# Patient Record
Sex: Female | Born: 1990 | Race: White | Hispanic: No | Marital: Single | Smoking: Never smoker
Health system: Southern US, Community
[De-identification: ages and names within clinical notes are randomized; demographics above are authoritative.]

---

## 2015-11-01 ENCOUNTER — Encounter (HOSPITAL_BASED_OUTPATIENT_CLINIC_OR_DEPARTMENT_OTHER): Payer: Self-pay | Admitting: *Deleted

## 2015-11-01 ENCOUNTER — Emergency Department (HOSPITAL_BASED_OUTPATIENT_CLINIC_OR_DEPARTMENT_OTHER)
Admission: EM | Admit: 2015-11-01 | Discharge: 2015-11-01 | Disposition: A | Discharge status: Other Acute Inpatient Hospital | Payer: Managed Care, Other (non HMO) | Attending: Emergency Medicine | Admitting: Emergency Medicine

## 2015-11-01 ENCOUNTER — Inpatient Hospital Stay (HOSPITAL_COMMUNITY)
Admission: AD | Admit: 2015-11-01 | Discharge: 2015-11-10 | DRG: 885 | Disposition: A | Discharge status: Home / Self Care | Payer: Managed Care, Other (non HMO) | Source: Intra-hospital | Attending: Psychiatry | Admitting: Psychiatry

## 2015-11-01 ENCOUNTER — Encounter (HOSPITAL_COMMUNITY): Payer: Self-pay | Admitting: Psychiatry

## 2015-11-01 ENCOUNTER — Emergency Department (HOSPITAL_BASED_OUTPATIENT_CLINIC_OR_DEPARTMENT_OTHER): Payer: Managed Care, Other (non HMO)

## 2015-11-01 DIAGNOSIS — F411 Generalized anxiety disorder: Secondary | ICD-10-CM | POA: Diagnosis present

## 2015-11-01 DIAGNOSIS — R443 Hallucinations, unspecified: Secondary | ICD-10-CM

## 2015-11-01 DIAGNOSIS — F323 Major depressive disorder, single episode, severe with psychotic features: Principal | ICD-10-CM | POA: Diagnosis present

## 2015-11-01 DIAGNOSIS — F29 Unspecified psychosis not due to a substance or known physiological condition: Secondary | ICD-10-CM | POA: Insufficient documentation

## 2015-11-01 DIAGNOSIS — F4320 Adjustment disorder, unspecified: Secondary | ICD-10-CM | POA: Diagnosis present

## 2015-11-01 DIAGNOSIS — N390 Urinary tract infection, site not specified: Secondary | ICD-10-CM | POA: Insufficient documentation

## 2015-11-01 DIAGNOSIS — F23 Brief psychotic disorder: Secondary | ICD-10-CM

## 2015-11-01 DIAGNOSIS — Z79899 Other long term (current) drug therapy: Secondary | ICD-10-CM | POA: Diagnosis not present

## 2015-11-01 DIAGNOSIS — R319 Hematuria, unspecified: Secondary | ICD-10-CM

## 2015-11-01 DIAGNOSIS — F410 Panic disorder [episodic paroxysmal anxiety] without agoraphobia: Secondary | ICD-10-CM | POA: Diagnosis present

## 2015-11-01 DIAGNOSIS — F4323 Adjustment disorder with mixed anxiety and depressed mood: Secondary | ICD-10-CM

## 2015-11-01 DIAGNOSIS — R410 Disorientation, unspecified: Secondary | ICD-10-CM | POA: Diagnosis present

## 2015-11-01 LAB — RAPID URINE DRUG SCREEN, HOSP PERFORMED
AMPHETAMINES: NOT DETECTED
Barbiturates: NOT DETECTED
Benzodiazepines: NOT DETECTED
Cocaine: NOT DETECTED
Opiates: NOT DETECTED
TETRAHYDROCANNABINOL: NOT DETECTED

## 2015-11-01 LAB — URINALYSIS, ROUTINE W REFLEX MICROSCOPIC
BILIRUBIN URINE: NEGATIVE
Glucose, UA: NEGATIVE mg/dL
KETONES UR: NEGATIVE mg/dL
Nitrite: POSITIVE — AB
Protein, ur: NEGATIVE mg/dL
SPECIFIC GRAVITY, URINE: 1.011 (ref 1.005–1.030)
pH: 6 (ref 5.0–8.0)

## 2015-11-01 LAB — CBC WITH DIFFERENTIAL/PLATELET
Basophils Absolute: 0 10*3/uL (ref 0.0–0.1)
Basophils Relative: 1 %
Eosinophils Absolute: 0.1 10*3/uL (ref 0.0–0.7)
Eosinophils Relative: 1 %
HEMATOCRIT: 41 % (ref 36.0–46.0)
HEMOGLOBIN: 13.9 g/dL (ref 12.0–15.0)
LYMPHS ABS: 1.4 10*3/uL (ref 0.7–4.0)
LYMPHS PCT: 18 %
MCH: 28.8 pg (ref 26.0–34.0)
MCHC: 33.9 g/dL (ref 30.0–36.0)
MCV: 84.9 fL (ref 78.0–100.0)
MONOS PCT: 9 %
Monocytes Absolute: 0.7 10*3/uL (ref 0.1–1.0)
NEUTROS ABS: 5.6 10*3/uL (ref 1.7–7.7)
NEUTROS PCT: 71 %
Platelets: 174 10*3/uL (ref 150–400)
RBC: 4.83 MIL/uL (ref 3.87–5.11)
RDW: 12.7 % (ref 11.5–15.5)
WBC: 7.8 10*3/uL (ref 4.0–10.5)

## 2015-11-01 LAB — COMPREHENSIVE METABOLIC PANEL
ALT: 21 U/L (ref 14–54)
ANION GAP: 5 (ref 5–15)
AST: 17 U/L (ref 15–41)
Albumin: 4.9 g/dL (ref 3.5–5.0)
Alkaline Phosphatase: 48 U/L (ref 38–126)
BILIRUBIN TOTAL: 1 mg/dL (ref 0.3–1.2)
BUN: 12 mg/dL (ref 6–20)
CALCIUM: 9.3 mg/dL (ref 8.9–10.3)
CO2: 24 mmol/L (ref 22–32)
CREATININE: 0.68 mg/dL (ref 0.44–1.00)
Chloride: 106 mmol/L (ref 101–111)
GFR calc non Af Amer: >60 mL/min (ref >60)
Glucose, Bld: 96 mg/dL (ref 65–99)
Potassium: 3.5 mmol/L (ref 3.5–5.1)
SODIUM: 135 mmol/L (ref 135–145)
Total Protein: 7.5 g/dL (ref 6.5–8.1)

## 2015-11-01 LAB — URINE MICROSCOPIC-ADD ON

## 2015-11-01 LAB — ETHANOL

## 2015-11-01 LAB — PREGNANCY, URINE: PREG TEST UR: NEGATIVE

## 2015-11-01 LAB — TSH: TSH: 1.447 u[IU]/mL (ref 0.350–4.500)

## 2015-11-01 MED ORDER — DEXTROSE 5 % IV SOLN
1.0000 g | Freq: Once | INTRAVENOUS | Status: AC
  Administered 2015-11-01: 1 g via INTRAVENOUS

## 2015-11-01 MED ORDER — CEPHALEXIN 250 MG PO CAPS: 500.0000 mg | ORAL_CAPSULE | Freq: Four times a day (QID) | ORAL | Status: DC

## 2015-11-01 MED ORDER — SODIUM CHLORIDE 0.9 % IV BOLUS (SEPSIS)
1000.0000 mL | Freq: Once | INTRAVENOUS | Status: AC
  Administered 2015-11-01: 1000 mL via INTRAVENOUS

## 2015-11-01 MED ORDER — ALUM & MAG HYDROXIDE-SIMETH 200-200-20 MG/5ML PO SUSP
30.0000 mL | ORAL | Status: DC | PRN
Start: 2015-11-01 — End: 2015-11-10

## 2015-11-01 MED ORDER — CEFTRIAXONE SODIUM 1 G IJ SOLR
INTRAMUSCULAR | Status: AC
  Filled 2015-11-01: qty 10

## 2015-11-01 MED ORDER — BENZTROPINE MESYLATE 1 MG PO TABS
1.0000 mg | ORAL_TABLET | Freq: Four times a day (QID) | ORAL | Status: DC | PRN
  Administered 2015-11-01 – 2015-11-02 (×3): 1 mg via ORAL
  Filled 2015-11-01 (×4): qty 1

## 2015-11-01 MED ORDER — TRAZODONE HCL 100 MG PO TABS
100.0000 mg | ORAL_TABLET | Freq: Every evening | ORAL | Status: DC | PRN
  Administered 2015-11-01 – 2015-11-09 (×8): 100 mg via ORAL
  Filled 2015-11-01: qty 1
  Filled 2015-11-01: qty 7
  Filled 2015-11-01 (×7): qty 1

## 2015-11-01 MED ORDER — HYDROXYZINE HCL 25 MG PO TABS
25.0000 mg | ORAL_TABLET | Freq: Four times a day (QID) | ORAL | Status: DC | PRN
Start: 2015-11-01 — End: 2015-11-10
  Administered 2015-11-01 – 2015-11-05 (×5): 25 mg via ORAL
  Filled 2015-11-01 (×2): qty 1
  Filled 2015-11-01: qty 10
  Filled 2015-11-01 (×4): qty 1

## 2015-11-01 MED ORDER — CEPHALEXIN 500 MG PO CAPS
500.0000 mg | ORAL_CAPSULE | Freq: Four times a day (QID) | ORAL | Status: DC
  Administered 2015-11-01 – 2015-11-10 (×35): 500 mg via ORAL
  Filled 2015-11-01 (×43): qty 1
  Filled 2015-11-01: qty 4
  Filled 2015-11-01: qty 1

## 2015-11-01 MED ORDER — HALOPERIDOL 5 MG PO TABS
5.0000 mg | ORAL_TABLET | Freq: Four times a day (QID) | ORAL | Status: DC | PRN
  Administered 2015-11-01 – 2015-11-02 (×4): 5 mg via ORAL
  Filled 2015-11-01 (×4): qty 1

## 2015-11-01 MED ORDER — MAGNESIUM HYDROXIDE 400 MG/5ML PO SUSP: 30.0000 mL | Freq: Every day | ORAL | Status: DC | PRN

## 2015-11-01 MED ORDER — ACETAMINOPHEN 325 MG PO TABS
650.0000 mg | ORAL_TABLET | Freq: Four times a day (QID) | ORAL | Status: DC | PRN
  Administered 2015-11-01: 650 mg via ORAL
  Filled 2015-11-01: qty 2

## 2015-11-01 NOTE — H&P (Signed)
Psychiatric Admission Assessment Adult  Patient Identification: Tracy Gross MRN:  [id number] Date of Evaluation:  11/01/2015 Chief Complaint: "I was shaky and jittery , there was a lot going on.'   Principal Diagnosis: Brief psychotic disorder Diagnosis:   Patient Active Problem List   Diagnosis Date Noted  . Adjustment disorder [F43.20] 11/01/2015  . Adjustment disorder with mixed anxiety and depressed mood [F43.23] 11/01/2015  . Brief psychotic disorder [F23] 11/01/2015  . UTI (urinary tract infection) [N39.0] 11/01/2015   History of Present Illness:: Tracy Gross is a 25 y.o. female, single, Caucasian , employed , lives with her partner , who presented to [facility] accompanied by her parents for worsening anxiety sx.   Per initial notes in EHR" Pt reports she has felt confused over the past ten days. She states that she is "seeing messages in everything" and that "things don't follow." Pt acknowledges that she feels out of touch with reality. Pt's mother states that Pt works as an Glass blower/designer for a psychologist and her employer informed Pt's partner that Pt started behaving oddly ten days ago. Parents report Pt has appeared confused, stating she doesn't have a twin sister (which she does). Pt has been trying to go into stranger's cars, walking out of the house with no idea where she is going and other strange behaviors. Pt described her mood as "all over the place." Pt reports symptoms including crying spells, social withdrawal, loss of interest in usual pleasures, irritability, decreased concentration, erratic appetite and feelings of hopelessness. Pt reports she has not slept in days. Pt denies current suicidal ideation or history of suicidal gestures. She denies intentional self-injurious behaviors. She denies homicidal ideation or history of violence. She says she rarely drinks alcohol and infrequently smokes marijuana. She reports last smoking marijuana two weeks ago and  has no reason to believe the marijuana might have been laced with another substance. She denies other substance use. Pt identifies work and relationships as stressors. When asked about problems in her relationship she replies "overcaring." Pt lives with a female partner in [place], [place] and parents [place] to assist her. Pt denies any history of abuse or trauma. Pt's mother says Pt may be grieving the death of her grandmother and great grandmother, both of whom died in 2014-12-13. Pt's denies any family history of mental health problems or substance abuse. When asked who in her life is supportive, Pt replies "nobody" but Pt's parents appear concerned and supportive. Pt has no history of inpatient or outpatient mental health or substance abuse treatment. She denies any history of being prescribed psychiatric medication."  Patient seen today and chart reviewed.Discussed patient with treatment team. Patient today presents as calm , cooperative , pleasant. Pt reports that she had been feeling stressed out due to several psychosocial stressors in her life including working two jobs , loss of her grand mother, great grand mother , moving to a new apartment in august and so on. Pt also reports relational issues with her partner. Pt reports that all these has been making her more and more anxious and sometimes she feels jittery. Pt otherwise denied any depressive sx, appetite changes, sleep issues , difficulty concentrating or having SI/HI.  Per initial notes in EHR - pt did report some AH - however did not express this to Probation officer. Pt also reportedly was getting messages from what she watches or hears , but she could not elaborate this to Probation officer. She instead stated  that people usually respond to you the way you do to them .  Pt denied OCD sx, bipolar do sx, hx of sexual or physical abuse.  Pt denied past hx of being on treatment for mental illness other than the most recent treatment with  Lexapro two days ago - which she stopped due to side effects.  Pt denies hx of suicide attempts.Denies hx of substance abuse.      Associated Signs/Symptoms: Depression Symptoms:  anxiety, (Hypo) Manic Symptoms:  denies Anxiety Symptoms:  anxiety about her relsational issues with partner Psychotic Symptoms:  did report in ED as having AH - deniesit now. PTSD Symptoms: Negative Total Time spent with patient: 45 minutes  Past Psychiatric History: Pt was recently started on Lexapro for anxiety sx, but stopped taking due to side effects. Denies hx of mental illness or being in an inpatient hospital in the past.Denies hx of suicide attempts.  Risk to Self:   Risk to Others:   Prior Inpatient Therapy:   Prior Outpatient Therapy:    Alcohol Screening:   Substance Abuse History in the last 12 months:  No. Consequences of Substance Abuse: Negative Previous Psychotropic Medications: Lexapro ( side effects) Psychological Evaluations: No  Past Medical History: Pt denies hx of medical issues like Thyroid sx, HTN,DM Family History: Pt denies hx of HTN, DM, Thyroid issues in family. Family History  Problem Relation Age of Onset  . Mental illness Neg Hx    Family Psychiatric  History: Pt denies hx of mental illness, substance abuse or suicide in family. 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  History  Alcohol Use  . Yes    Comment: occasional      History  Drug Use No    Social History   Social History  . Marital Status: Single    Spouse Name: N/A  . Number of Children: N/A  . Years of Education: N/A   Social History Main Topics  . Smoking status: Never Smoker   . Smokeless tobacco: None  . Alcohol Use: Yes     Comment: occasional   . Drug Use: No  . Sexual Activity: Not Asked   Other Topics Concern  . None   Social  History Narrative   Additional Social History:                         Allergies:   Allergies  Allergen Reactions  . Nickel Rash   Lab Results:  Results for orders placed or performed during the hospital encounter of 11/01/15 (from the past 48 hour(s))  CBC with Differential     Status: None   Collection Time: 11/01/15  3:36 AM  Result Value Ref Range   WBC 7.8 4.0 - 10.5 K/uL   RBC 4.83 3.87 - 5.11 MIL/uL   Hemoglobin 13.9 12.0 - 15.0 g/dL   HCT 41.0 36.0 - 46.0 %   MCV 84.9 78.0 - 100.0 fL   MCH 28.8 26.0 - 34.0 pg   MCHC 33.9 30.0 - 36.0 g/dL   RDW 12.7 11.5 - 15.5 %   Platelets 174 150 - 400 K/uL   Neutrophils Relative % 71 %   Neutro Abs 5.6 1.7 - 7.7 K/uL   Lymphocytes Relative 18 %   Lymphs Abs 1.4 0.7 - 4.0 K/uL  Monocytes Relative 9 %   Monocytes Absolute 0.7 0.1 - 1.0 K/uL   Eosinophils Relative 1 %   Eosinophils Absolute 0.1 0.0 - 0.7 K/uL   Basophils Relative 1 %   Basophils Absolute 0.0 0.0 - 0.1 K/uL  Comprehensive metabolic panel     Status: None   Collection Time: 11/01/15  3:36 AM  Result Value Ref Range   Sodium 135 135 - 145 mmol/L   Potassium 3.5 3.5 - 5.1 mmol/L   Chloride 106 101 - 111 mmol/L   CO2 24 22 - 32 mmol/L   Glucose, Bld 96 65 - 99 mg/dL   BUN 12 6 - 20 mg/dL   Creatinine, Ser 0.68 0.44 - 1.00 mg/dL   Calcium 9.3 8.9 - 10.3 mg/dL   Total Protein 7.5 6.5 - 8.1 g/dL   Albumin 4.9 3.5 - 5.0 g/dL   AST 17 15 - 41 U/L   ALT 21 14 - 54 U/L   Alkaline Phosphatase 48 38 - 126 U/L   Total Bilirubin 1.0 0.3 - 1.2 mg/dL   GFR calc non Af Amer >60 >60 mL/min   GFR calc Af Amer >60 >60 mL/min    Comment: (NOTE) The eGFR has been calculated using the CKD EPI equation. This calculation has not been validated in all clinical situations. eGFR's persistently <60 mL/min signify possible Chronic Kidney Disease.    Anion gap 5 5 - 15  TSH     Status: None   Collection Time: 11/01/15  3:37 AM  Result Value Ref Range   TSH 1.447 0.350  - 4.500 uIU/mL    Comment: Performed at [facility]  Ethanol     Status: None   Collection Time: 11/01/15  4:28 AM  Result Value Ref Range   Alcohol, Ethyl (B) <5 <5 mg/dL    Comment:        LOWEST DETECTABLE LIMIT FOR SERUM ALCOHOL IS 5 mg/dL FOR MEDICAL PURPOSES ONLY   Urinalysis, Routine w reflex microscopic (not at [facility])     Status: Abnormal   Collection Time: 11/01/15  6:30 AM  Result Value Ref Range   Color, Urine YELLOW YELLOW   APPearance CLOUDY (A) CLEAR   Specific Gravity, Urine 1.011 1.005 - 1.030   pH 6.0 5.0 - 8.0   Glucose, UA NEGATIVE NEGATIVE mg/dL   Hgb urine dipstick LARGE (A) NEGATIVE   Bilirubin Urine NEGATIVE NEGATIVE   Ketones, ur NEGATIVE NEGATIVE mg/dL   Protein, ur NEGATIVE NEGATIVE mg/dL   Nitrite POSITIVE (A) NEGATIVE   Leukocytes, UA TRACE (A) NEGATIVE  Pregnancy, urine     Status: None   Collection Time: 11/01/15  6:30 AM  Result Value Ref Range   Preg Test, Ur NEGATIVE NEGATIVE    Comment:        THE SENSITIVITY OF THIS METHODOLOGY IS >20 mIU/mL.   Urine rapid drug screen (hosp performed)     Status: None   Collection Time: 11/01/15  6:30 AM  Result Value Ref Range   Opiates NONE DETECTED NONE DETECTED   Cocaine NONE DETECTED NONE DETECTED   Benzodiazepines NONE DETECTED NONE DETECTED   Amphetamines NONE DETECTED NONE DETECTED   Tetrahydrocannabinol NONE DETECTED NONE DETECTED   Barbiturates NONE DETECTED NONE DETECTED    Comment:        DRUG SCREEN FOR MEDICAL PURPOSES ONLY.  IF CONFIRMATION IS NEEDED FOR ANY PURPOSE, NOTIFY LAB WITHIN 5 DAYS.        LOWEST DETECTABLE LIMITS FOR  URINE DRUG SCREEN Drug Class       Cutoff (ng/mL) Amphetamine      1000 Barbiturate      200 Benzodiazepine   086 Tricyclics       578 Opiates          300 Cocaine          300 THC              50   Urine microscopic-add on     Status: Abnormal   Collection Time: 11/01/15  6:30 AM  Result Value Ref Range   Squamous Epithelial / LPF 0-5 (A)  NONE SEEN   WBC, UA 0-5 0 - 5 WBC/hpf   RBC / HPF 0-5 0 - 5 RBC/hpf   Bacteria, UA MANY (A) NONE SEEN    Metabolic Disorder Labs:  No results found for: HGBA1C, MPG No results found for: PROLACTIN No results found for: CHOL, TRIG, HDL, CHOLHDL, VLDL, LDLCALC  Current Medications: Current Facility-Administered Medications  Medication Dose Route Frequency Provider Last Rate Last Dose  . acetaminophen (TYLENOL) tablet 650 mg  650 mg Oral Q6H PRN Ursula Alert, MD      . alum & mag hydroxide-simeth (MAALOX/MYLANTA) 200-200-20 MG/5ML suspension 30 mL  30 mL Oral Q4H PRN Duy Lemming, MD      . haloperidol (HALDOL) tablet 5 mg  5 mg Oral Q6H PRN Ursula Alert, MD       And  . benztropine (COGENTIN) tablet 1 mg  1 mg Oral Q6H PRN Eleonor Ocon, MD      . cephALEXin (KEFLEX) capsule 500 mg  500 mg Oral 4 times per day Ursula Alert, MD      . hydrOXYzine (ATARAX/VISTARIL) tablet 25 mg  25 mg Oral Q6H PRN Nyhla Mountjoy, MD      . magnesium hydroxide (MILK OF MAGNESIA) suspension 30 mL  30 mL Oral Daily PRN Ursula Alert, MD       PTA Medications: Prescriptions prior to admission  Medication Sig Dispense Refill Last Dose  . escitalopram (LEXAPRO) 10 MG tablet Take 10 mg by mouth.     . escitalopram (LEXAPRO) 10 MG tablet Take 10 mg by mouth daily.       Musculoskeletal: Strength & Muscle Tone: within normal limits Gait & Station: normal Patient leans: N/A  Psychiatric Specialty Exam: Physical Exam  Nursing note and vitals reviewed. Constitutional: She is oriented to person, place, and time. She appears well-developed and well-nourished.  HENT:  Head: Normocephalic and atraumatic.  Eyes: Conjunctivae and EOM are normal.  Neck: Normal range of motion. Neck supple. No thyromegaly present.  Cardiovascular: Normal rate and regular rhythm.   Respiratory: Effort normal and breath sounds normal.  GI: Soft.  Musculoskeletal: Normal range of motion.  Neurological: She is alert and  oriented to person, place, and time.  Skin: Skin is warm.  Psychiatric: Her speech is normal and behavior is normal. Judgment and thought content normal. Her mood appears anxious. Cognition and memory are normal.    Review of Systems  Psychiatric/Behavioral: The patient is nervous/anxious.   All other systems reviewed and are negative.   Last menstrual period 11/01/2015.There is no height or weight on file to calculate BMI.  General Appearance: Fairly Groomed  Engineer, water::  Good  Speech:  Clear and Coherent  Volume:  Decreased  Mood:  Anxious  Affect:  Appropriate  Thought Process:  Coherent  Orientation:  Full (Time, Place, and Person)  Thought Content:  Rumination  Suicidal Thoughts:  No  Homicidal Thoughts:  No  Memory:  Immediate;   Fair Recent;   Fair Remote;   Fair  Judgement:  Fair  Insight:  Fair  Psychomotor Activity:  Restlessness  Concentration:  Poor  Recall:  [employer] of Knowledge:Fair  Language: Fair  Akathisia:  No  Handed:  Right  AIMS (if indicated):     Assets:  Desire for Improvement  ADL's:  Intact  Cognition: WNL  Sleep:        Treatment Plan Summary: Patient with no past hx of mental illness, reported recent onset of anxiety , reports several psychosocial stressors building up over the past year , several deaths in family, working two jobs, relational issues with partner. Pt currently denies any psychosis.  Patient also with UTI - started on Keflex- unknown if this is also causing some of her confusion/psychosis . Reports she wants to try getting better with out medications. Will continue to observe on the unit.  Daily contact with patient to assess and evaluate symptoms and progress in treatment and Medication management  Patient will benefit from inpatient treatment and stabilization.  Estimated length of stay is 5-7 days.  Reviewed past medical records,treatment plan.  Will start Vistaril 25 mg po q6h prn for severe anxiety sx. Will make  available Haldol prn for psychosis. Pt at this time does not want to be on any medications . Continue Keflex 500 mg po qid for UTI. Will add Haldol 5 mg po prn for agitation/psychosis. Will continue to monitor vitals ,medication compliance and treatment side effects while patient is here.  Will monitor for medical issues as well as call consult as needed.  Reviewed labs ,UA - shows possibility of UTI, urine clx pending, TSH - wnl, UDS- negative , CBC-wnl, bmp -wnl, CT scan - wnl. CSW will start working on disposition. Expand collateral information. Patient to participate in therapeutic milieu .       Observation Level/Precautions:  15 minute checks    Psychotherapy:  Individual and group therapy     Consultations:  Social worker  Discharge Concerns: stability and safety        I certify that inpatient services furnished can reasonably be expected to improve the patient's condition.   Iriana Artley md 1/15/20171:52 PM

## 2015-11-01 NOTE — Progress Notes (Signed)
Tracy Gross spent most of the day.Marland Kitchen.after arriving onto the unit..influenza the dayroom. She was quiet. Had diff answering questions and then around dinnertime...she became visibly paranoid, guarded, unable to respond verbally. She was given prn haldol, which she swallowed, but spit up the po cogentin she was offered. She began to wander in and out of other patients room, was unable to remember where her room was, was not able to take direction from staff and was becoming agitated and visibly frightenend. At this point, this Clinical research associatewriter spoke with CN ( Tracy Gross) and [facility] and NP and pt was escorted to the 500 hall ( where she was intitially assigned to stay) and 1:1 was intitaed to create safety and prevent pt to wander into other patients' rooms. Unsure if haldol was actually benenficial.

## 2015-11-01 NOTE — Progress Notes (Signed)
Tracy Gross is a 25 yo single female who [facility] after experiencing periods of confusion with bizarre behaviors over the past 1.5 weeks. Her mother and father are at her side at the time of admission and she is quiet, flat and answers slowly, in one -word questions and is slow not only to process but also to respond. Her mother says pt has been at their  home , living there with them  the -past 1.5 wks...since her significant other as well as her employers ( 2) both  Identified  Bizarre behaviors ( confusion, disorientation, getting into others' cars, unable to identify her twin sister) and pt moved in with them. Pt [place]in [place] apt...with her sign other...and has for over 4 years. SHe says she moved to a new apt, this past Aug and her mother shares she lost both her grandmother as well as her great grandmother this past year. She says she is allergic to nickel, states she has no thoughts of SI, HI .Marland Kitchen.Marland Kitchen. And has no medical  Problems.Marland Kitchen.other than the uti she is currently being treated for. She denies any depression, Admission is completed .

## 2015-11-01 NOTE — ED Provider Notes (Addendum)
TIME SEEN: 4:00 AM  CHIEF COMPLAINT: Altered mental status  HPI: Pt is a 25 y.o. female with no known past medical history who presents to the emergency department with her parents with concerns for a week and a half of altered mental status, confusion. They state that they were seen by the PA at their physician's office yesterday and were prescribed Lexapro for presumed severe anxiety. They stated the patient had multiple different side effects after taking Lexapro and they have not given it again. Their report that patient cannot remember her own twin sister. They state that she lives in [place] with her significant other and that her significant other and coworkers were noticing bizarre behavior. Mother reports that since being with them at home she has tried to get in her car and leave several times. They state that she has recently lost her grandmother in May and in her great-grandmother in October. They feel like the holidays were harder on her than they realize. They deny any family history of any psychiatric illness. Patient has never had a suicide attempt or psychiatric hospitalization. They report she does drink alcohol occasionally but has no history of drug abuse. They deny any recent known infectious symptoms including vomiting, diarrhea, cough, fever. No known head injury. She is not on any other medications other than Lexapro which was started yesterday. She does endorse auditory hallucinations and states that people on the television and radio are telling her what to do. She denies that they're telling her to hurt herself or anyone else.  ROS: Level V caveat for altered mental status  PAST MEDICAL HISTORY/PAST SURGICAL HISTORY:  History reviewed. No pertinent past medical history.  MEDICATIONS:  Prior to Admission medications   Medication Sig Start Date End Date Taking? Authorizing Provider  escitalopram (LEXAPRO) 10 MG tablet Take 10 mg by mouth daily.   Yes Historical Provider, MD     ALLERGIES:  Not on File  SOCIAL HISTORY:  Social History  Substance Use Topics  . Smoking status: Never Smoker   . Smokeless tobacco: Not on file  . Alcohol Use: Yes     Comment: occasional     FAMILY HISTORY: No family history on file.  EXAM: BP 156/97 mmHg  Pulse 93  Temp(Src) 98.5 F (36.9 C)  Resp 20  Ht 5\' 4"  (1.626 m)  Wt 190 lb (86.183 kg)  BMI 32.60 kg/m2  SpO2 98%  LMP 11/01/2015 (Exact Date) CONSTITUTIONAL: Alert and oriented 3 but has bizarre affect, will answer some questions appropriately but does appear confused, when I walk in the room she is trying to walk out the door and her father is able to redirect her and she is continue daily picking at her IV trying to pull it out HEAD: Normocephalic EYES: Conjunctivae clear, PERRL ENT: normal nose; no rhinorrhea; moist mucous membranes; pharynx without lesions noted NECK: Supple, no meningismus, no LAD  CARD: RRR; S1 and S2 appreciated; no murmurs, no clicks, no rubs, no gallops RESP: Normal chest excursion without splinting or tachypnea; breath sounds clear and equal bilaterally; no wheezes, no rhonchi, no rales, no hypoxia or respiratory distress, speaking full sentences ABD/GI: Normal bowel sounds; non-distended; soft, non-tender, no rebound, no guarding, no peritoneal signs BACK:  The back appears normal and is non-tender to palpation, there is no CVA tenderness EXT: Normal ROM in all joints; non-tender to palpation; no edema; normal capillary refill; no cyanosis, no calf tenderness or swelling    SKIN: Normal color for age and  race; warm NEURO: Moves all extremities equally, sensation to light touch intact diffusely, cranial nerves II through XII intact PSYCH: Bizarre affect, patient smiling inappropriately, denies SI or HI. She does endorse auditory hallucinations and states that people on the TV and radio are telling her what to do  MEDICAL DECISION MAKING: Patient here with what appears to be a psychotic  episode. No known history of psychiatric illness. Appears prior to this episode that has been going on for the past week and a half she is otherwise normal, healthy. Family reports that she has a stable relationship, [place] and works as a psychiatric facility herself. They deny any known head injuries, infectious symptoms, drug or alcohol abuse. She does not appear intoxicated currently. Is otherwise neurologically intact except for bizarre behavior. Will obtain screening labs, urine and head CT to evaluate for any possible organic cause for the psychotic episode. I do not feel the patient is safe to be discharged home in the family agrees. Patient agrees to stay. Will consult psychiatry for evaluation.  ED PROGRESS: Patient's labs are unremarkable. Her head CT shows no acute abnormality. Urinalysis pending.   5:20 AM  [facility]  [facility] Dr[facility] Elna BreslowEappen after 8am.     7:00 AM  Pt does have a nitrite positive urinary tract infection. My suspicion that this is the cause of her psychosis and hallucinations is very low. We'll send urine culture and give her dose of IV ceftriaxone in the emergency department. I have ordered for her to get Keflex 500 mg 4 times a day for the next 5 days starting tomorrow morning at 7 AM. Family has been updated. Her pregnancy test is negative. Urine drug screen is negative. I feel she is medically cleared after she receives these antibiotics to go to behavioral health hospital. Patient agrees to go voluntarily at this time. I have not completed IVC paperwork.  Layla MawKristen N Ward, DO 11/01/15 [id number]    Of note, I did not restart patient's Lexapro given mother reports multiple side effects to it after only one dose.  Layla MawKristen N Ward, DO 11/01/15 [phone]

## 2015-11-01 NOTE — ED Notes (Signed)
TTS complete, pt up to b/r with family for attempt at capturing a urine sample. Steady gait. Cooperative.

## 2015-11-01 NOTE — ED Notes (Addendum)
Pt states that she has been having some anxiety for past several weeks. Family states over the past 6 months pt has had some anxiety issues over loss of some family members. Mom states her PA had planned on doing some labwork next week, but in the meantime pt was started on lexapro for anxiety. Mom states pt has had some confusion issues on and off for the past 1.5 weeks that family PA was aware of. Family states pt has also displayed some tremors on and off as well. Pt presents alerted at present and calm. Will answer questions appropriately.

## 2015-11-01 NOTE — ED Notes (Signed)
Pt ambulatory to CT, family present, IVF infusing.

## 2015-11-01 NOTE — ED Notes (Signed)
Back from CT, IVF infusing w.o to gravity, no changes, calm, NAD, alert, passively interactive.

## 2015-11-01 NOTE — ED Notes (Signed)
Unable to give urine sample (2nd attempt). Back to stretcher. [facility]  Pt alert, NAD, calm, interactive, unchanged. Dr. Elesa MassedWard into room.

## 2015-11-01 NOTE — ED Notes (Signed)
TTS in progress, [facility] Pt remains calm, interactive, NAD, and confused. (confused about IV at this time, re-directed)

## 2015-11-01 NOTE — ED Notes (Signed)
Pt not in room, up to b/r with family.

## 2015-11-01 NOTE — ED Notes (Signed)
Attempting urine sample

## 2015-11-01 NOTE — [facility] Suicide Risk Assessment (Signed)
[facility]   Nursing information obtained from:    Demographic factors:    Current Mental Status:    Loss Factors:    Historical Factors:    Risk Reduction Factors:    Total Time spent with patient: 30 minutes Principal Problem: Adjustment disorder with mixed anxiety and depressed mood Diagnosis:   Patient Active Problem List   Diagnosis Date Noted  . Adjustment disorder [F43.20] 11/01/2015  . Adjustment disorder with mixed anxiety and depressed mood [F43.23] 11/01/2015  . 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  World Science writerHealth Organization [facility](WHO). Score between 0-7:  no or low risk or alcohol related problems. Score between 8-15:  moderate risk of alcohol related problems. Score between 16-19:  high risk of alcohol related problems. Score 20 or above:  warrants further diagnostic evaluation for alcohol dependence and treatment.   CLINICAL FACTORS:   Severe Anxiety and/or Agitation   Musculoskeletal: Strength & Muscle Tone: within normal limits Gait & Station: normal Patient leans: N/A  Psychiatric Specialty Exam: Physical Exam  Review of Systems  Psychiatric/Behavioral: The patient is nervous/anxious.   All other systems reviewed and are negative.   Last menstrual period 11/01/2015.There is no height or weight on file to calculate BMI.  Please see H&P.                                                        COGNITIVE FEATURES THAT CONTRIBUTE TO RISK:  Closed-mindedness, Polarized thinking and Thought constriction (tunnel vision)    SUICIDE RISK:   Mild:  Suicidal ideation of limited frequency, intensity, duration, and specificity.  There are no identifiable plans, no associated intent, mild dysphoria and related symptoms, good self-control (both objective and subjective  assessment), few other risk factors, and identifiable protective factors, including available and accessible social support.  PLAN OF CARE: Please see H&P.   Medical Decision Making:  Review of Psycho-Social Stressors (1), Review or order clinical lab tests (1), Discuss test with performing physician (1), Review and summation of old records (2), Established Problem, Worsening (2), Review of Last Therapy Session (1), Review or order medicine tests (1) and Review of New Medication or Change in Dosage (2)  I certify that inpatient services furnished can reasonably be expected to improve the patient's condition.   Zera Markwardt MD 11/01/2015, 1:24 PM

## 2015-11-01 NOTE — Tx Team (Addendum)
Initial Interdisciplinary Treatment Plan   PATIENT STRESSORS: Educational concerns Financial difficulties Health problems   PATIENT STRENGTHS: Ability for insight Active sense of humor Average or above average intelligence Capable of independent living   PROBLEM LIST: Problem List/Patient Goals Date to be addressed Date deferred Reason deferred Estimated date of resolution  Psychosis 11/01/2015     Uti 11/01/2015                 " I'm very motivated" 11/01/2015                              DISCHARGE CRITERIA:  Ability to meet basic life and health needs Adequate post-discharge living arrangements Improved stabilization in mood, thinking, and/or behavior Medical problems require only outpatient monitoring Motivation to continue treatment in a less acute level of care  PRELIMINARY DISCHARGE PLAN: Attend aftercare/continuing care group Attend PHP/IOP Attend 12-step recovery group  PATIENT/FAMIILY INVOLVEMENT: This treatment plan has been presented to and reviewed with the patient, Tracy Gross, and/or family member, .  The patient and family have been given the opportunity to ask questions and make suggestions.  [name], [name] [name] 11/01/2015, 7:40 PM

## 2015-11-01 NOTE — ED Notes (Signed)
Up to b/r with mother, steady gait.  

## 2015-11-01 NOTE — BH Assessment (Addendum)
Tele Assessment Note   Tracy Gross is an 25 y.o. female, single, Caucasian who [facility] accompanied by her parents, who participated in assessment with Pt's consent. Pt reports she has felt confused over the past ten days. She states that she is "seeing messages in everything" and that "things don't follow." Pt acknowledges that she feels out of touch with reality. 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 Parents report Pt has appeared confused, stating she doesn't have a twin sister (which she does). Pt has been trying to go into stranger's cars, walking out of the house with no idea where she is going and other strange behaviors. Pt described her mood as "all over the place." Pt reports symptoms including crying spells, social withdrawal, loss of interest in usual pleasures, irritability, decreased concentration, erratic appetite and feelings of hopelessness. Pt reports she has not slept in days. Pt denies current suicidal ideation or history of suicidal gestures. She denies intentional self-injurious behaviors. She denies homicidal ideation or history of violence. She says she rarely drinks alcohol and infrequently smokes marijuana. She reports last smoking marijuana two weeks ago and has no reason to believe the marijuana might have been laced with another substance. She denies other substance use.  Pt identifies work and relationships as stressors. When asked about problems in her relationship she replies "overcaring." Pt lives [place] a female partner in [place] and [place] to assist her. Pt denies any history of abuse or trauma. Pt's mother says Pt may be grieving the death of her grandmother and great grandmother, both of whom died in 2016. Pt's denies any family history of mental health problems or substance abuse. When  asked who in her life is supportive, Pt replies "nobody" but Pt's parents appear concerned and supportive. Pt has no history of inpatient or outpatient mental health or substance abuse treatment. She denies any history of being prescribed psychiatric medication.  Pt is dressed in jeans and a t-shirt. She is alert and oriented to person, place and situation but not date. Pt cannot identify the president of the [place]. Pt's speech is soft. Motor behavior is restless and Pt attempted to remove her I.V. Eye contact is fair. Pt's mood is anxious and affect is congruent with mood. Thought process is coherent but Pt gave brief responses to all questions. Pt was generally cooperative during assessment. She states she is willing to sign voluntarily into a psychiatric facility.   Diagnosis: Unspecified Psychotic Disorder  Past Medical History: History reviewed. No pertinent past medical history.  History reviewed. No pertinent past surgical history.  Family History: No family history on file.  Social History:  reports that she has never smoked. She does not have any smokeless tobacco history on file. She reports that she drinks alcohol. She reports that she does not use illicit drugs.  Additional Social History:  Alcohol / Drug Use Pain Medications: Denies abuse Prescriptions: Denies abuse Over the Counter: Denies abuse History of alcohol / drug use?: Yes Longest period of sobriety (when/how long): NA Substance #1 Name of Substance 1: Marijuana 1 - Age of First Use: unknown 1 - Amount (size/oz): small amount 1 - Frequency: Pt reports infrequent use 1 - Duration: Ongoing 1 - Last Use / Amount: Two weeks ago  CIWA: CIWA-Ar BP: 156/97 mmHg Pulse Rate: 93 COWS:  PATIENT STRENGTHS: (choose at least two) Ability for insight Average or above average intelligence Capable of independent living Communication skills Financial means General fund of knowledge Motivation for treatment/growth Physical  Health Special hobby/interest Supportive family/friends Work skills  Allergies: Not on File  Home Medications:  (Not in a hospital admission)  OB/GYN Status:  Patient's last menstrual period was 11/01/2015 (exact date).  General Assessment Data Location of Assessment: [facility] TTS Assessment: In system Is this a Tele or Face-to-Face Assessment?: Tele Assessment Is this an Initial Assessment or a Re-assessment for this encounter?: Initial Assessment Marital status: Long term relationship Maiden name: NA Is patient pregnant?: No Pregnancy Status: No Living Arrangements: Spouse/significant other (Lives with female partner) Can pt return to current living arrangement?: Yes Admission Status: Voluntary Is patient capable of signing voluntary admission?: Yes Referral Source: Self/Family/Friend Insurance type: Community education officer     Crisis Care Plan Living Arrangements: Spouse/significant other (Lives with female partner) Armed forces operational officer Guardian: Other: (None) Name of Psychiatrist: None Name of Therapist: None  Education Status Is patient currently in school?: No Current Grade: NA Highest grade of school patient has completed: Bachelor's degree Name of school: [employer] person: NA  Risk to self with the past 6 months Suicidal Ideation: No Has patient been a risk to self within the past 6 months prior to admission? : No Suicidal Intent: No Has patient had any suicidal intent within the past 6 months prior to admission? : No Is patient at risk for suicide?: No Suicidal Plan?: No Has patient had any suicidal plan within the past 6 months prior to admission? : No Access to Means: No What has been your use of drugs/alcohol within the last 12 months?: Pt reports infrequent marijuana use Previous Attempts/Gestures: No How many times?: 0 Other Self Harm Risks: None Triggers for Past Attempts: None known Intentional Self Injurious Behavior:  None Family Suicide History: No Recent stressful life event(s): Loss (Comment) (Grandmother and great grandmother died last year) Persecutory voices/beliefs?: No Depression: Yes Depression Symptoms: Despondent, Insomnia, Tearfulness, Isolating, Fatigue, Guilt, Loss of interest in usual pleasures, Feeling worthless/self pity, Feeling angry/irritable Substance abuse history and/or treatment for substance abuse?: No Suicide prevention information given to non-admitted patients: Not applicable  Risk to Others within the past 6 months Homicidal Ideation: No Does patient have any lifetime risk of violence toward others beyond the six months prior to admission? : No Thoughts of Harm to Others: No Current Homicidal Intent: No Current Homicidal Plan: No Access to Homicidal Means: No Identified Victim: None History of harm to others?: No Assessment of Violence: None Noted Violent Behavior Description: Pt denies history of violence Does patient have access to weapons?: Yes (Comment) (Guns in parents home) Criminal Charges Pending?: No Does patient have a court date: No Is patient on probation?: No  Psychosis Hallucinations: Visual (See assessment note) Delusions: Unspecified (See assessment note)  Mental Status Report Appearance/Hygiene: Other (Comment) (Jeans and t-shirt) Eye Contact: Fair Motor Activity: Restlessness Speech: Soft Level of Consciousness: Alert Mood: Anxious Affect: Anxious Anxiety Level: Moderate Thought Processes: Coherent Judgement: Partial Orientation: Person, Place, Situation, Time, Appropriate for developmental age Obsessive Compulsive Thoughts/Behaviors: None  Cognitive Functioning Concentration: Poor Memory: Recent Intact, Remote Intact IQ: Average Insight: Poor Impulse Control: Poor Appetite: Fair Weight Loss: 0 Weight Gain: 0 Sleep: Decreased Total Hours of Sleep: 1 Vegetative Symptoms: None  ADLScreening [facility] Assessment Services) Patient's  cognitive ability adequate to safely complete daily activities?: Yes Patient able to express need  for assistance with ADLs?: Yes Independently performs ADLs?: Yes (appropriate for developmental age)  Prior Inpatient Therapy Prior Inpatient Therapy: No Prior Therapy Dates: NA Prior Therapy Facilty/Provider(s): NA Reason for Treatment: NA  Prior Outpatient Therapy Prior Outpatient Therapy: No Prior Therapy Dates: NA Prior Therapy Facilty/Provider(s): NA Reason for Treatment: NA Does patient have an ACCT team?: No Does patient have Intensive In-House Services?  : No Does patient have [facility] services? : No Does patient have P4CC services?: No  ADL Screening (condition at time of admission) Patient's cognitive ability adequate to safely complete daily activities?: Yes Is the patient deaf or have difficulty hearing?: No Does the patient have difficulty seeing, even when wearing glasses/contacts?: No Does the patient have difficulty concentrating, remembering, or making decisions?: No Patient able to express need for assistance with ADLs?: Yes Does the patient have difficulty dressing or bathing?: No Independently performs ADLs?: Yes (appropriate for developmental age) Does the patient have difficulty walking or climbing stairs?: No Weakness of Legs: None Weakness of Arms/Hands: None  Home Assistive Devices/Equipment Home Assistive Devices/Equipment: None    Abuse/Neglect Assessment (Assessment to be complete while patient is alone) Physical Abuse: Denies Verbal Abuse: Denies Sexual Abuse: Denies Exploitation of patient/patient's resources: Denies     Merchant navy officer (For Healthcare) Does patient have an advance directive?: No Would patient like information on creating an advanced directive?: No - patient declined information    Additional Information 1:1 In Past 12 Months?: No CIRT Risk: No Elopement Risk: No Does patient have medical clearance?: No      Disposition: Binnie Rail, AC at [facility], confirmed a bed will be available after 0800. Gave clinical report to Maryjean Morn, PA who said Pt meets criteria for inpatient psychiatric evaluation and accepted Pt to the service of Dr. Elvera Maria, [phone] after Pt is medically cleared and after 0800. Notified Kristen Ward, DO and staff at [facility] staff of acceptance.  Disposition Initial Assessment Completed for this Encounter: Yes Disposition of Patient: Inpatient treatment program Type of inpatient treatment program: Adult  Pamalee Leyden, [facility], [facility], [facility] Triage Specialist [phone]   Pamalee Leyden 11/01/2015 5:13 AM

## 2015-11-02 NOTE — Progress Notes (Signed)
RN 1:1 Note D: Pt in bed resting with eyes closed. Respirations even and unlabored. Pt appears to be in no signs of distress at this time. A: 1:1 observation remains for pt's safety. R: Pt remains safe at this time.      

## 2015-11-02 NOTE — Progress Notes (Addendum)
RN 1:1 Note D: Pt in bed resting with eyes closed. Respirations even and unlabored. Pt appears to be in no signs of distress at this time.  A: 1:1 observation remains for this pt.  R: Pt remains safe at this time.  

## 2015-11-02 NOTE — Progress Notes (Signed)
RN 1:1 Note D: Pt in bed resting with eyes closed. Respirations even and unlabored. Pt appears to be in no signs of distress at this time.  A: 1:1 observation remains for this pt.  R: Pt remains safe at this time.  

## 2015-11-02 NOTE — Progress Notes (Signed)
1:1 DAR Note: Tracy Gross has been visible on the unit.  Sitter remains by side.  She has to be redirected frequently for wandering in and out of other's rooms.  New order for no roommate has been obtained as she is rummaging in roommates belongings.  She has attended afternoon group.  No interaction with peers.  She denies SI/HI or A/V hallucinations.  Prn for agitation given with good relief.  She denies any pain or discomfort and appears to be in no physical distress.  She was able to complete her self inventory and reports that her depression and anxiety is 7/10 and hopelessness is 5/10.  She reports that her goal for today is "fixing my relationship" and she will accomplish this goal by "not feeling bad for myself."  Encouraged participation in group and unit activities.  We will continue to monitor the progress towards her goals.  Tracy Gross remains safe on unit with sitter by side.

## 2015-11-02 NOTE — Progress Notes (Signed)
D: Pt presents bizarre in behavior. Pt reports feeling "sad". Pt denied any SI/HI/AVH. Pt required encouragement to ambulate from her bathroom to her bed (no physical limitations). Pt often spoke words of self-encouragement. " I can do this". Pt reported having racing thoughts.  A: Writer administered scheduled and prn medications to pt, per MD orders. Continued support and availability as needed was extended to this pt. Staff continues to monitor pt with q5115min checks.  R: No adverse drug reactions noted. Pt receptive to treatment. Pt remains safe at this time.

## 2015-11-02 NOTE — Progress Notes (Signed)
[facility] MD Progress Note  11/02/2015 4:29 PM Louvenia Golomb  MRN:  [id number] Subjective:  Patient states " I am still a little confused at times."  Objective: Rosita Fire placed on 1:1 Observation. Patient is awake, alert and oriented X3 , found pacing the halls.  Denies suicidal or homicidal ideation. Denies auditory or visual hallucination. Patient is responding to internal stimuli.  However is redirectable with her thought process. Patient is disorganized with her thought process. Patient reports she is does not take medications. Patient reports attending group session.  States her depression 2/10. Patient states "I am still a little confused at times." Per staff patient is pacing, taking clothing off in day room and entering others room. Staff reports patient can be intrusive and combative at time.  Reports good appetite other wise and resting well.. Support, encouragement and reassurance was provided.   Principal Problem: Brief psychotic disorder Diagnosis:   Patient Active Problem List   Diagnosis Date Noted  . Adjustment disorder with mixed anxiety and depressed mood [F43.23] 11/01/2015  . Brief psychotic disorder [F23] 11/01/2015  . UTI (urinary tract infection) [N39.0] 11/01/2015   Total Time spent with patient: 30 minutes    Past Medical History: History reviewed. No pertinent past medical history. History reviewed. No pertinent past surgical history. Family History:  Family History  Problem Relation Age of Onset  . Mental illness Neg Hx     Social History:  History  Alcohol Use  . Yes    Comment: occasional      History  Drug Use No    Social History   Social History  . Marital Status: Single    Spouse Name: N/A  . Number of Children: N/A  . Years of Education: N/A   Social History Main Topics  . Smoking status: Never Smoker   . Smokeless tobacco: None  . Alcohol Use: Yes     Comment: occasional   . Drug Use: No  . Sexual Activity: Not Asked   Other Topics  Concern  . None   Social History Narrative   Additional Social History:    Pain Medications: n/a Name of Substance 1: n/a                  Sleep: Fair  Appetite:  Good  Current Medications: Current Facility-Administered Medications  Medication Dose Route Frequency Provider Last Rate Last Dose  . acetaminophen (TYLENOL) tablet 650 mg  650 mg Oral Q6H PRN Ursula Alert, MD   650 mg at 11/01/15 1605  . alum & mag hydroxide-simeth (MAALOX/MYLANTA) 200-200-20 MG/5ML suspension 30 mL  30 mL Oral Q4H PRN Saramma Eappen, MD      . haloperidol (HALDOL) tablet 5 mg  5 mg Oral Q6H PRN Ursula Alert, MD   5 mg at 11/02/15 1202   And  . benztropine (COGENTIN) tablet 1 mg  1 mg Oral Q6H PRN Ursula Alert, MD   1 mg at 11/02/15 1202  . cephALEXin (KEFLEX) capsule 500 mg  500 mg Oral 4 times per day Ursula Alert, MD   500 mg at 11/02/15 1202  . hydrOXYzine (ATARAX/VISTARIL) tablet 25 mg  25 mg Oral Q6H PRN Ursula Alert, MD   25 mg at 11/02/15 1610  . magnesium hydroxide (MILK OF MAGNESIA) suspension 30 mL  30 mL Oral Daily PRN Ursula Alert, MD      . traZODone (DESYREL) tablet 100 mg  100 mg Oral QHS PRN Harriet Butte, NP   100 mg at  11/01/15 2302    Lab Results:  Results for orders placed or performed during the hospital encounter of 11/01/15 (from the past 48 hour(s))  CBC with Differential     Status: None   Collection Time: 11/01/15  3:36 AM  Result Value Ref Range   WBC 7.8 4.0 - 10.5 K/uL   RBC 4.83 3.87 - 5.11 MIL/uL   Hemoglobin 13.9 12.0 - 15.0 g/dL   HCT 41.0 36.0 - 46.0 %   MCV 84.9 78.0 - 100.0 fL   MCH 28.8 26.0 - 34.0 pg   MCHC 33.9 30.0 - 36.0 g/dL   RDW 12.7 11.5 - 15.5 %   Platelets 174 150 - 400 K/uL   Neutrophils Relative % 71 %   Neutro Abs 5.6 1.7 - 7.7 K/uL   Lymphocytes Relative 18 %   Lymphs Abs 1.4 0.7 - 4.0 K/uL   Monocytes Relative 9 %   Monocytes Absolute 0.7 0.1 - 1.0 K/uL   Eosinophils Relative 1 %   Eosinophils Absolute 0.1 0.0 - 0.7  K/uL   Basophils Relative 1 %   Basophils Absolute 0.0 0.0 - 0.1 K/uL  Comprehensive metabolic panel     Status: None   Collection Time: 11/01/15  3:36 AM  Result Value Ref Range   Sodium 135 135 - 145 mmol/L   Potassium 3.5 3.5 - 5.1 mmol/L   Chloride 106 101 - 111 mmol/L   CO2 24 22 - 32 mmol/L   Glucose, Bld 96 65 - 99 mg/dL   BUN 12 6 - 20 mg/dL   Creatinine, Ser 0.68 0.44 - 1.00 mg/dL   Calcium 9.3 8.9 - 10.3 mg/dL   Total Protein 7.5 6.5 - 8.1 g/dL   Albumin 4.9 3.5 - 5.0 g/dL   AST 17 15 - 41 U/L   ALT 21 14 - 54 U/L   Alkaline Phosphatase 48 38 - 126 U/L   Total Bilirubin 1.0 0.3 - 1.2 mg/dL   GFR calc non Af Amer >60 >60 mL/min   GFR calc Af Amer >60 >60 mL/min    Comment: (NOTE) The eGFR has been calculated using the CKD EPI equation. This calculation has not been validated in all clinical situations. eGFR's persistently <60 mL/min signify possible Chronic Kidney Disease.    Anion gap 5 5 - 15  TSH     Status: None   Collection Time: 11/01/15  3:37 AM  Result Value Ref Range   TSH 1.447 0.350 - 4.500 uIU/mL    Comment: Performed at [facility]  Ethanol     Status: None   Collection Time: 11/01/15  4:28 AM  Result Value Ref Range   Alcohol, Ethyl (B) <5 <5 mg/dL    Comment:        LOWEST DETECTABLE LIMIT FOR SERUM ALCOHOL IS 5 mg/dL FOR MEDICAL PURPOSES ONLY   Urinalysis, Routine w reflex microscopic (not at [facility])     Status: Abnormal   Collection Time: 11/01/15  6:30 AM  Result Value Ref Range   Color, Urine YELLOW YELLOW   APPearance CLOUDY (A) CLEAR   Specific Gravity, Urine 1.011 1.005 - 1.030   pH 6.0 5.0 - 8.0   Glucose, UA NEGATIVE NEGATIVE mg/dL   Hgb urine dipstick LARGE (A) NEGATIVE   Bilirubin Urine NEGATIVE NEGATIVE   Ketones, ur NEGATIVE NEGATIVE mg/dL   Protein, ur NEGATIVE NEGATIVE mg/dL   Nitrite POSITIVE (A) NEGATIVE   Leukocytes, UA TRACE (A) NEGATIVE  Pregnancy, urine  Status: None   Collection Time: 11/01/15  6:30 AM   Result Value Ref Range   Preg Test, Ur NEGATIVE NEGATIVE    Comment:        THE SENSITIVITY OF THIS METHODOLOGY IS >20 mIU/mL.   Urine rapid drug screen (hosp performed)     Status: None   Collection Time: 11/01/15  6:30 AM  Result Value Ref Range   Opiates NONE DETECTED NONE DETECTED   Cocaine NONE DETECTED NONE DETECTED   Benzodiazepines NONE DETECTED NONE DETECTED   Amphetamines NONE DETECTED NONE DETECTED   Tetrahydrocannabinol NONE DETECTED NONE DETECTED   Barbiturates NONE DETECTED NONE DETECTED    Comment:        DRUG SCREEN FOR MEDICAL PURPOSES ONLY.  IF CONFIRMATION IS NEEDED FOR ANY PURPOSE, NOTIFY LAB WITHIN 5 DAYS.        LOWEST DETECTABLE LIMITS FOR URINE DRUG SCREEN Drug Class       Cutoff (ng/mL) Amphetamine      1000 Barbiturate      200 Benzodiazepine   419 Tricyclics       379 Opiates          300 Cocaine          300 THC              50   Urine microscopic-add on     Status: Abnormal   Collection Time: 11/01/15  6:30 AM  Result Value Ref Range   Squamous Epithelial / LPF 0-5 (A) NONE SEEN   WBC, UA 0-5 0 - 5 WBC/hpf   RBC / HPF 0-5 0 - 5 RBC/hpf   Bacteria, UA MANY (A) NONE SEEN  Urine culture     Status: None (Preliminary result)   Collection Time: 11/01/15  6:30 AM  Result Value Ref Range   Specimen Description URINE, CLEAN CATCH    Special Requests NONE    Culture      TOO YOUNG TO READ Performed at [facility]    Report Status PENDING     Physical Findings: AIMS: Facial and Oral Movements Muscles of Facial Expression: None, normal Lips and Perioral Area: None, normal Jaw: None, normal Tongue: None, normal,Extremity Movements Upper (arms, wrists, hands, fingers): None, normal Lower (legs, knees, ankles, toes): None, normal, Trunk Movements Neck, shoulders, hips: None, normal, Overall Severity Severity of abnormal movements (highest score from questions above): None, normal Incapacitation due to abnormal movements: None,  normal Patient's awareness of abnormal movements (rate only patient's report): No Awareness, Dental Status Current problems with teeth and/or dentures?: No Does patient usually wear dentures?: No  CIWA:  CIWA-Ar Total: 1 COWS:  COWS Total Score: 1  Musculoskeletal: Strength & Muscle Tone: within normal limits Gait & Station: normal Patient leans: N/A  Psychiatric Specialty Exam: Review of Systems  Psychiatric/Behavioral: Positive for depression. Negative for suicidal ideas and hallucinations.  All other systems reviewed and are negative.   Blood pressure 125/76, pulse 120, temperature 98.5 F (36.9 C), temperature source Oral, resp. rate 16, height 5' 4"  (1.626 m), weight 86.183 kg (190 lb), last menstrual period 11/01/2015, SpO2 100 %.Body mass index is 32.6 kg/(m^2).  General Appearance: Fairly Groomed, pleasant,clam and redirectable  Eye Contact::  Good  Speech:  Clear and Coherent  Volume:  Decreased  Mood:  Anxious and Irritable  Affect:  Flat  Thought Process:  Tangential  Orientation:  Full (Time, Place, and Person)  Thought Content:  Paranoid Ideation and Rumination  Suicidal Thoughts:  No  Homicidal Thoughts:  No  Memory:  Immediate;   Fair Recent;   Poor Remote;   Poor  Judgement:  Impaired  Insight:  Lacking  Psychomotor Activity:  Restlessness  Concentration:  Poor  Recall:  Poor  Fund of Knowledge:Poor  Language: Good  Akathisia:  Yes  Handed:  Right  AIMS (if indicated):     Assets:  Communication Skills Desire for Improvement Resilience Social Support  ADL's:  Intact  Cognition: WNL  Sleep:        I agree with current treatment plan on 11/02/2015, Patient seen face-to-face for psychiatric evaluation follow-up. Reviewed the information documented and agree with the treatment plan.  Treatment Plan Summary: Daily contact with patient to assess and evaluate symptoms and progress in treatment and Medication management   Patient will benefit from  inpatient treatment and stabilization.  Estimated length of stay is 5-7 days.  Reviewed past medical records,treatment plan.  Continue Vistaril 25 mg po q6h prn for severe anxiety sx. Will make available Haldol prn for psychosis. Pt at this time does not want to be on any medications . Continue Keflex 500 mg po qid for UTI. Continue Haldol 5 mg po prn for agitation/psychosis. Will continue to monitor vitals ,medication compliance and treatment side effects while patient is here.  Will monitor for medical issues as well as call consult as needed.  Reviewed labs ,UA - shows possibility of UTI, urine clx pending, TSH - wnl, UDS- negative , CBC-wnl, bmp -wnl, CT scan - wnl. CSW will start working on disposition. Expand collateral information. Patient to participate in therapeutic milieu .  Marcy Panning LewisFNP-BC 11/02/2015, 4:29 PM I agree with assessment and plan Geralyn Flash A. Sabra Heck, M.D.

## 2015-11-02 NOTE — Progress Notes (Signed)
1:1 DAR Note: Tracy Gross remains on 1:1 with sitter by side.  Tracy Gross continues to wander in and out of rooms and requires considerable redirection.  She is very confused.  She has asked multiple times for us to get an eye exam while she is here because she doesn't think her prescription is right in her glasses.  She denies SI/HI or A/V hallucinations.  She denies any pain and appears to be in no physical distress.  PRN of Vistaril given with good relief due to her being anxious and pacing.  1:1 continues for safety.  Encouraged her to fill out self inventory but has not at this time.  She attended AM group but fell asleep and was redirected to her room.  We will continue to monitor the progress towards her goals.  Tracy Gross remains safe on the unit.

## 2015-11-02 NOTE — [facility] Group Notes (Signed)
[facility] LCSW Group Therapy  11/02/2015 , 4:52 PM   Type of Therapy:  Group Therapy  Participation Level:  Active  Participation Quality:  Attentive  Affect:  Appropriate  Cognitive:  Alert  Insight:  Improving  Engagement in Therapy:  Engaged  Modes of Intervention:  Discussion, Exploration and Socialization  Summary of Progress/Problems: Today's group focused on the term Diagnosis.  Participants were asked to define the term, and then pronounce whether it is a negative, positive or neutral term.  Stayed for half the time.  Appeared to be attentive and engaged while there.  Did not contribute, even when directly encouraged.  Tracy Gross, Tracy Gross 11/02/2015 , 4:52 PM

## 2015-11-02 NOTE — Progress Notes (Addendum)
Darl PikesSusan has been pleasant but confused at the onset of the shift. She was very pleased that she was visited by her mother, father and brother today. She requested from this RN if there had been a full psychological evaluation done on her including IQ testing and the plan for her medical care upon discharge. I explained to her that I would let the provider know about her requests. She is alert and oriented. Rates Anxiety 7/10 Depression 0/10 Hopelessness 0/10. Denied SI/HI/AVH and contracts for safety.

## 2015-11-02 NOTE — Progress Notes (Signed)
1:1 Tracy Gross had been pleasant but confused during the initial onset of the shift. However, around 2220 she became more confused, paranoid, anxious and slightly agitated. Upon entering her room she has packed all of her belongings in a bag. The MHT states Tracy Gross has required more redirection in the past thirty minutes. She is now appearing as if she is responding to internal stimuli (turning her head side to side quickly as if hearing someone speaking to her) and having racing thoughts. She is repeatedly stating "I want to speak to the doctor". This Clinical research associatewriter questioned the patient's need to speak with the doctor and she stated "I want to go home". This Clinical research associatewriter explained to the patient that the doctor and on call provider were available for emergencies and not discharge planning at this time of the night. When I questioned was there an emergency she states "I am worried about my family". I explained to her this was not something we would need to call the doctor about but I would certainly let the doctor know about her concerns in the morning. She then states "I'm concerned about my fiance". I explained to Tracy Gross once again that although her concerns were very valid, they were not true emergencies. I then proceeded to question her again as to what was her emergency and she states "I feel suicidal". I explained to her that this is the reason we have a sitter monitoring her at all times. When I ask her if there is anything I can do to help with her anxiety and to help her rest she states "leave me alone". She the stands up and turns her light off in her room and lays on the bed under the covers. She does not respond to any more of my questions. Upon leaving her room the MHT (sitter) remains at the bedside of the patient. Will continue to monitor for patient safety and any changes in mental status.

## 2015-11-02 NOTE — Progress Notes (Signed)
1:1 DAR Note: Tracy Gross has been asleep much of the afternoon.  She has been c/o of anxiety and agitation.  PRN's given with good relief.  She remains on 1:1 with sitter by side.  She denies SI/HI or A/V hallucinations.  She continues to have episodes of confusion and asks repeatedly if she can see an eye doctor to get new prescription for contacts.  Reminded her that she will need to visit her eye doctor when she is discharged from the hospital.  She has eaten well.  Encouraged fluids.  She takes medications without difficulty.  1:1 remains for safety and redirection from wandering in and out of peers rooms.  Encouraged participation in group and unit activities.  We will continue to monitor the progress towards her goals.  Tracy Gross remains safe on the unit.

## 2015-11-03 MED ORDER — CITALOPRAM HYDROBROMIDE 10 MG PO TABS
5.0000 mg | ORAL_TABLET | Freq: Every day | ORAL | Status: DC
  Administered 2015-11-03 – 2015-11-04 (×2): 5 mg via ORAL
  Filled 2015-11-03 (×4): qty 1

## 2015-11-03 MED ORDER — BENZTROPINE MESYLATE 1 MG PO TABS
1.0000 mg | ORAL_TABLET | Freq: Four times a day (QID) | ORAL | Status: DC | PRN
  Administered 2015-11-03 – 2015-11-07 (×3): 1 mg via ORAL
  Filled 2015-11-03 (×3): qty 1

## 2015-11-03 MED ORDER — HALOPERIDOL 5 MG PO TABS
5.0000 mg | ORAL_TABLET | Freq: Four times a day (QID) | ORAL | Status: DC | PRN
  Administered 2015-11-03 – 2015-11-07 (×3): 5 mg via ORAL
  Filled 2015-11-03 (×3): qty 1

## 2015-11-03 MED ORDER — HALOPERIDOL 1 MG PO TABS
1.0000 mg | ORAL_TABLET | Freq: Three times a day (TID) | ORAL | Status: DC
  Administered 2015-11-03 – 2015-11-05 (×6): 1 mg via ORAL
  Filled 2015-11-03 (×9): qty 1
  Filled 2015-11-03: qty 2

## 2015-11-03 MED ORDER — BENZTROPINE MESYLATE 0.5 MG PO TABS
0.5000 mg | ORAL_TABLET | Freq: Two times a day (BID) | ORAL | Status: DC
  Administered 2015-11-03 – 2015-11-10 (×14): 0.5 mg via ORAL
  Filled 2015-11-03: qty 14
  Filled 2015-11-03 (×3): qty 1
  Filled 2015-11-03: qty 14
  Filled 2015-11-03 (×13): qty 1

## 2015-11-03 NOTE — Tx Team (Signed)
Interdisciplinary Treatment Plan Update (Adult)  Date:  11/03/2015   Time Reviewed:  8:16 AM   Progress in Treatment: Attending groups: Intermittently Participating in groups:  No Taking medication as prescribed:  Yes. Tolerating medication:  Yes. Family/Significant other contact made:  No Patient understands diagnosis:  Yes  As evidenced by seeking help with "stress" Discussing patient identified problems/goals with staff:  Yes, see initial care plan. Medical problems stabilized or resolved:  Yes. Denies suicidal/homicidal ideation: Yes. Issues/concerns per patient self-inventory:  No. Other:  New problem(s) identified:  Discharge Plan or Barriers:  Reason for Continuation of Hospitalization: Anxiety Depression Medication stabilization Other; describe Paranoia, confusion  Comments:  Tracy Gross is an 25 y.o. female, single, Caucasian who presents to [facility] accompanied by her parents, who participated in assessment with Pt's consent. Pt reports she has felt confused over the past ten days. She states that she is "seeing messages in everything" and that "things don't follow." Pt acknowledges that she feels out of touch with reality. Pt's mother states that Pt works as an Glass blower/designer for a psychologist and her employer informed Pt's partner that Pt started behaving oddly ten days ago. Parents report Pt has appeared confused, stating she doesn't have a twin sister (which she does). Pt has been trying to go into stranger's cars, walking out of the house with no idea where she is going and other strange behaviors. Pt described her mood as "all over the place." Pt reports symptoms including crying spells, social withdrawal, loss of interest in usual pleasures, irritability, decreased concentration, erratic appetite and feelings of hopelessness. Pt reports she has not slept in days. Pt denies current suicidal ideation or history of suicidal gestures. She denies intentional  self-injurious behaviors. She denies homicidal ideation or history of violence. She says she rarely drinks alcohol and infrequently smokes marijuana. She reports last smoking marijuana two weeks ago and has no reason to believe the marijuana might have been laced with another substance. She denies other substance use.  Pt identifies work and relationships as stressors. When asked about problems in her relationship she replies "overcaring." Pt lives with a female partner in [place], [place] and parents brought [place] to [place] to assist her. Pt denies any history of abuse or trauma. Pt's mother says Pt may be grieving the death of her grandmother and great grandmother, both of whom died in 12-16-14. Pt's denies any family history of mental health problems or substance abuse. When asked who in her life is supportive, Pt replies "nobody" but Pt's parents appear concerned and supportive. Pt has no history of inpatient or outpatient mental health or substance abuse treatment. She denies any history of being prescribed psychiatric medication. Patient with no past hx of mental illness, reported recent onset of anxiety , reports several psychosocial stressors building up over the past year , several deaths in family, working two jobs, relational issues with partner. Pt currently denies any psychosis.  Patient also with UTI - on Keflex- unknown if this is also causing some of her confusion/psychosis . Patient today is agreeable to starting low dose Haldol with Celexa. Continue 1;1 precaution for safety. Will start a trial of Haldol 1 mg po tid for psychosis. Will add Cogentin 0.5 mg po bid for EPS. Will add Celexa 5 mg po daily for affective sx. Will continue Trazodone 100 mg po qhs for sleep. Continue Vistaril 25 mg po q6h prn for severe anxiety sx. Continue Keflex 500 mg po qid  for UTI. Continue Haldol 5 mg po prn for agitation/psychosis.  Estimated length of stay: 4-5 days  New goal(s):  Review  of initial/current patient goals per problem list:   Review of initial/current patient goals per problem list:  1. Goal(s): Patient will participate in aftercare plan   Met: Yes   Target date: 3-5 days post admission date   As evidenced by: Patient will participate within aftercare plan AEB aftercare provider and housing plan at discharge being identified. 11/03/15:  Return home, follow up outpt   2. Goal (s): Patient will exhibit decreased depressive symptoms and suicidal ideations.   Met: Yes   Target date: 3-5 days post admission date   As evidenced by: Patient will utilize self rating of depression at 3 or below and demonstrate decreased signs of depression or be deemed stable for discharge by MD. 11/03/15:  Pt rates her depression a 3 today     3. Goal(s): Patient will demonstrate decreased signs and symptoms of anxiety.   Met: Yes   Target date: 3-5 days post admission date   As evidenced by: Patient will utilize self rating of anxiety at 3 or below and demonstrated decreased signs of anxiety, or be deemed stable for discharge by MD 11/03/15:  Rates anxiety at a 3 today     5. Goal(s): Patient will demonstrate decreased signs of psychosis  * Met: No  * Target date: 3-5 days post admission date  * As evidenced by: Patient will demonstrate decreased frequency of AVH or return to baseline function 11/03/15:  Pt reports paranoia, confusion       Attendees: Patient:  11/03/2015 8:16 AM   Family:   11/03/2015 8:16 AM   Physician:  Ursula Alert, MD 11/03/2015 8:16 AM   Nursing:   Gaylan Gerold, RN 11/03/2015 8:16 AM   CSW:    Roque Lias, LCSW   11/03/2015 8:16 AM   Other:  11/03/2015 8:16 AM   Other:   11/03/2015 8:16 AM   Other:  Lars Pinks, Nurse CM 11/03/2015 8:16 AM   Other:   11/03/2015 8:16 AM   Other:  Norberto Sorenson, Combs  11/03/2015 8:16 AM   Other:  11/03/2015 8:16 AM   Other:  11/03/2015 8:16 AM   Other:  11/03/2015 8:16 AM   Other:   11/03/2015 8:16 AM   Other:  11/03/2015 8:16 AM   Other:   11/03/2015 8:16 AM    Scribe for Treatment Team:   Roque Lias B, 11/03/2015 8:16 AM

## 2015-11-03 NOTE — Progress Notes (Addendum)
1:1 DAR Note: Tracy Gross remains on 1:1 with sitter by side.  She continues to wander the unit and is demanding to leave.  She has attempted to go out the unit doors multiple times and is difficult to redirect.  She denies any SI/HI or A/V hallucinations.  She still has noticeable thought blocking.  She has only eating bites of her lunch.  Encouraged her to drink plenty of fluids.  She completed her self inventory and reports that her depression, anxiety and hopelessness are 3/10.  Her goal for today is to "go home" and she hopes to accomplish this by "being patient."  Minimal participation in group and unit activities.  No interaction with peers.  1:1 remains at side.  Encouraged continued participation in group and unit activities.  We will monitor the progress towards her goals.  Tracy Gross remains safe on the unit.

## 2015-11-03 NOTE — [facility] Counselor (Signed)
Adult Comprehensive Assessment  Patient ID: Tracy Gross, female   DOB: Feb 18, 1991, 25 y.o.   MRN: [id number]  Information Source: Information source: Patient  Current Stressors:  Housing / Lack of housing: moved into a new apartment in August.it was dirty with some broken appliances which has been very stressful Bereavement / Loss: "I lost my great grandmother and grandmother in the last year  Living/Environment/Situation:  Living Arrangements: Spouse/significant other Living conditions (as described by patient or guardian): "Don't like the apartment, but we are making it work." How long has patient lived in current situation?: since August What is atmosphere in current home: Supportive, Loving  Family History:  Marital status: Long term relationship Long term relationship, how long?: 4.5 years What types of issues is patient dealing with in the relationship?: "It's a good relationship.  We are engaged, but no date." Are you sexually active?: Yes What is your sexual orientation?: lesbian Has your sexual activity been affected by drugs, alcohol, medication, or emotional stress?: No Does patient have children?: No  Childhood History:  By whom was/is the patient raised?: Both parents Description of patient's relationship with caregiver when they were a child: good Patient's description of current relationship with people who raised him/her: good Does patient have siblings?: Yes Number of Siblings: 3 Description of patient's current relationship with siblings: good Did patient suffer any verbal/emotional/physical/sexual abuse as a child?: No Did patient suffer from severe childhood neglect?: No Has patient ever been sexually abused/assaulted/raped as an adolescent or adult?: No Was the patient ever a victim of a crime or a disaster?: No Witnessed domestic violence?: No Has patient been effected by domestic violence as an adult?: No  Education:  Currently a Consulting civil engineer?: No Learning  disability?: No  Employment/Work Situation:   Employment situation: Employed 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 How long has patient been employed?: 4 years-since graduation Patient's job has been impacted by current illness: Yes Describe how patient's job has been impacted: unable to concentrate What is the longest time patient has a held a job?: see above Where was the patient employed at that time?: see above Has patient ever been in the [employer]?: No Has patient ever served in combat?: No Are There Guns or Other Weapons in Your Home?: No  Financial Resources:   Financial resources: Income from employment Does patient have a Lawyer or guardian?: No  Alcohol/Substance Abuse:   Alcohol/Substance Abuse Treatment Hx: Denies past history Has alcohol/substance abuse ever caused legal problems?: No  Social Support System:   Conservation officer, nature Support System: Good Describe Community Support System: SO, family, frinds Type of faith/religion: Methodist How does patient's faith help to cope with current illness?: Singing hymns and prayer give me strength  Leisure/Recreation:   Leisure and Hobbies: reading, biking, walking, organizing, planning  Strengths/Needs:   What things does the patient do well?: listening to people, rugby In what areas does patient struggle / problems for patient: asking for help, trusting others  Discharge Plan:   Does patient have access to transportation?: Yes Will patient be returning to same living situation after discharge?: Yes Currently receiving community mental health services: No If no, would patient like referral for services when discharged?: Yes (What county?) (here or in [place]) Does patient have financial barriers related to discharge medications?: No  Summary/Recommendations:   Summary and Recommendations (to be completed by the evaluator): Tracy Gross is a 25 YO Caucasian female who has  no previous mental health history and  is diagnosed with Brief Psychotic D/O and UTI.  She states that she started feeling paranoia and confused a couple of weeks ago, and her parents came to pick her up to help following the request of her SO for help.  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  She hopes to go back to school  to get her master's degreee at some point.  she can benefit from crises stabilization, medication management, therapeutic milieu and referral for services.  Daryel Gerald B. 11/03/2015

## 2015-11-03 NOTE — Progress Notes (Signed)
1:1 DAR Note: Tracy Gross continues to wander the unit.  During 5pm medication pass, she was smiling more and she states that she was feeling "pretty good."  She too her medications without difficulty.  She still exhibits thought blocking when answering questions.  She attempted multiple times to leave the unit and had to be redirected to stay for supper.  Explained to her that when she is feeling better and not on 1:1 she will be able to leave the unit for meals.  She walked back to her room and went to sleep.  Sitter remains by side for safety.  Tracy Gross denies SI/HI or A/V hallucinations.  Encouraged continued participation in group and unit activities.  1:1 remains for safety.  We will continue to monitor the progress towards her goals.  Tracy Gross remains safe on the unit.

## 2015-11-03 NOTE — Progress Notes (Signed)
Pt did not attend wrap-up group   

## 2015-11-03 NOTE — [facility] Group Notes (Signed)
[facility] Group Notes:  (Nursing/MHT/Case Management/Adjunct)  Date:  11/03/2015  Time:  10:38 AM  Type of Therapy:  Nurse Education  Participation Level:  Minimal  Participation Quality:  Inattentive  Affect:  Flat  Cognitive:  Lacking  Insight:  Limited  Engagement in Group:  Lacking and Limited  Modes of Intervention:  Discussion and Education  Summary of Progress/Problems:  Group topic today was recovery.  Discussed coping skills and developing a "tool box" to use when feeling bad.  Discussed sleep hygiene.    Norm Parcel Codey Burling 11/03/2015, 10:38 AM

## 2015-11-03 NOTE — Progress Notes (Signed)
RN 1:1 Note D: Pt in bed resting with eyes closed. Respirations even and unlabored. Pt appears to be in no signs of distress at this time. A: 1:1 observation remains for pt's safety. R: Pt remains safe at this time.      

## 2015-11-03 NOTE — Progress Notes (Signed)
1:1 Kitty remains on 1:1. She has been asleep for most of the night. She is safe on the unit. Will continue to monitor for safety and medication effectiveness.

## 2015-11-03 NOTE — Progress Notes (Addendum)
[facility] MD Progress Note  11/03/2015 2:24 PM Tracy Gross  MRN:  [id number] Subjective:  Patient states " I am better. I started getting confused a month ago , there was a lot going on."  Objective:Charlissa Gross is a 25 y.o. female, single, Caucasian , employed , lives with her partner , who presented to [facility] accompanied by her parents for worsening anxiety sx.  Patient seen and chart reviewed.Discussed patient with treatment team.  Patient is currently on 1;1 precaution due to her being confused on and off. Pt however this AM , appeared to be alert, oriented x3. Pt reports paranoia , however denies at this time . Pt denies AH/VH. Pt agreeable to be started on low dose Haldol along with an SSRI for anxiety sx. Will continue to observe pt on the unit.     Principal Problem: Brief psychotic disorder Diagnosis:   Patient Active Problem List   Diagnosis Date Noted  . Adjustment disorder with mixed anxiety and depressed mood [F43.23] 11/01/2015  . Brief psychotic disorder [F23] 11/01/2015  . UTI (urinary tract infection) [N39.0] 11/01/2015   Total Time spent with patient: 30 minutes  Past Psychiatric History: Pt was recently started on Lexapro for anxiety sx, but stopped taking due to side effects. Denies hx of mental illness or being in an inpatient hospital in the past.Denies hx of suicide attempts.   Past Medical History: Pt denies hx of medical issues like Thyroid sx, HTN,DM Family History: Pt denies hx of HTN, DM, Thyroid issues in family.  Family History  Problem Relation Age of Onset  . Mental illness Neg Hx    Family Psychiatric History: Pt denies hx of mental illness, substance abuse or suicide in family. 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   History  Alcohol Use  . Yes    Comment: occasional       History  Drug Use No    Social History   Social History  . Marital Status: Single    Spouse Name: N/A  . Number of Children: N/A  . Years of Education: N/A   Social History Main Topics  . Smoking status: Never Smoker   . Smokeless tobacco: None  . Alcohol Use: Yes     Comment: occasional   . Drug Use: No  . Sexual Activity: Not Asked   Other Topics Concern  . None   Social History Narrative   Additional Social History:    Pain Medications: n/a Name of Substance 1: n/a                  Sleep: Fair  Appetite:  Good  Current Medications: Current Facility-Administered Medications  Medication Dose Route Frequency Provider Last Rate Last Dose  . acetaminophen (TYLENOL) tablet 650 mg  650 mg Oral Q6H PRN Jomarie Longs, MD   650 mg at 11/01/15 1605  . alum & mag hydroxide-simeth (MAALOX/MYLANTA) 200-200-20 MG/5ML suspension 30 mL  30 mL Oral Q4H PRN Jomarie Longs, MD      . benztropine (COGENTIN) tablet 0.5 mg  0.5 mg Oral BID Raul Winterhalter, MD   0.5 mg at 11/03/15 1214  . haloperidol (HALDOL) tablet 5 mg  5 mg Oral Q6H PRN Jomarie Longs, MD       And  . benztropine (COGENTIN) tablet 1 mg  1 mg Oral  Q6H PRN Jomarie Longs, MD      . cephALEXin (KEFLEX) capsule 500 mg  500 mg Oral 4 times per day Jomarie Longs, MD   500 mg at 11/03/15 1213  . citalopram (CELEXA) tablet 5 mg  5 mg Oral Daily Anterio Scheel, MD   5 mg at 11/03/15 0500  . haloperidol (HALDOL) tablet 1 mg  1 mg Oral TID Jomarie Longs, MD   1 mg at 11/03/15 1213  . hydrOXYzine (ATARAX/VISTARIL) tablet 25 mg  25 mg Oral Q6H PRN Jomarie Longs, MD   25 mg at 11/02/15 1610  . magnesium hydroxide (MILK OF MAGNESIA) suspension 30 mL  30 mL Oral Daily PRN Jomarie Longs, MD      . traZODone (DESYREL) tablet 100 mg  100 mg Oral QHS PRN Worthy Flank, NP   100 mg at 11/02/15 2118    Lab Results:  No results found for this or any previous visit (from the past 48 hour(s)).  Physical Findings: AIMS:  Facial and Oral Movements Muscles of Facial Expression: None, normal Lips and Perioral Area: None, normal Jaw: None, normal Tongue: None, normal,Extremity Movements Upper (arms, wrists, hands, fingers): None, normal Lower (legs, knees, ankles, toes): None, normal, Trunk Movements Neck, shoulders, hips: None, normal, Overall Severity Severity of abnormal movements (highest score from questions above): None, normal Incapacitation due to abnormal movements: None, normal Patient's awareness of abnormal movements (rate only patient's report): No Awareness, Dental Status Current problems with teeth and/or dentures?: No Does patient usually wear dentures?: No  CIWA:  CIWA-Ar Total: 7 COWS:  COWS Total Score: 2  Musculoskeletal: Strength & Muscle Tone: within normal limits Gait & Station: normal Patient leans: N/A  Psychiatric Specialty Exam: Review of Systems  Psychiatric/Behavioral: Positive for depression. Negative for suicidal ideas and hallucinations. The patient is nervous/anxious.   All other systems reviewed and are negative.   Blood pressure 125/76, pulse 120, temperature 98 F (36.7 C), temperature source Oral, resp. rate 16, height  (1.626 m), weight 86.183 kg (190 lb), last menstrual period 11/01/2015, SpO2 100 %.Body mass index is 32.6 kg/(m^2).  General Appearance: Fairly Groomed  Patent attorney::  Good  Speech:  Clear and Coherent  Volume:  Decreased  Mood:  Anxious  Affect:  Congruent  Thought Process:  Goal Directed  Orientation:  Full (Time, Place, and Person)  Thought Content:  Paranoid Ideation and Rumination improving  Suicidal Thoughts:  No  Homicidal Thoughts:  No  Memory:  Immediate;   Fair Recent;   Fair Remote;   Fair  Judgement:  Impaired  Insight:  Lacking  Psychomotor Activity:  Restlessness  Concentration:  Poor  Recall:  Poor  Fund of Knowledge:Poor  Language: Good  Akathisia:  No  Handed:  Right  AIMS (if indicated):     Assets:   Communication Skills Desire for Improvement Resilience Social Support  ADL's:  Intact  Cognition: WNL  Sleep:  Number of Hours: 6.75      Treatment Plan Summary:Patient with no past hx of mental illness, reported recent onset of anxiety , reports several psychosocial stressors building up over the past year , several deaths in family, working two jobs, relational issues with partner. Pt currently denies any psychosis.  Patient also with UTI -  on Keflex- unknown if this is also causing some of her confusion/psychosis . Patient today is agreeable to starting low dose Haldol with Celexa. Will continue treatment. Daily contact with patient to assess and evaluate symptoms and progress  in treatment and Medication management   Continue 1;1 precaution for safety. Will start a trial of Haldol 1 mg po tid for psychosis. Will add Cogentin 0.5 mg po bid for EPS. Will add Celexa 5 mg po daily for affective sx. Will continue Trazodone 100 mg po qhs for sleep. Continue Vistaril 25 mg po q6h prn for severe anxiety sx.  Continue Keflex 500 mg po qid for UTI. Continue Haldol 5 mg po prn for agitation/psychosis. Will continue to monitor vitals ,medication compliance and treatment side effects while patient is here.  Will monitor for medical issues as well as call consult as needed.  Reviewed labs ,UA - shows possibility of UTI, urine clx pending, TSH - wnl, UDS- negative , CBC-wnl, bmp -wnl, CT scan - wnl.  CSW will start working on disposition. Patient to participate in therapeutic milieu .  Normon Pettijohn MD 11/03/2015, 2:24 PM

## 2015-11-03 NOTE — Progress Notes (Signed)
1:1 DAR Note: Tracy Gross has been up and visible on the unit.  Sitter remains by side for safety and suicidal statements.  She currently denies SI/HI.  She denies A/V hallucinations.  He has noticeable thought blocking when answering questions.  She denies any physical symptoms at this time.  She attended AM group with the nurse.  She didn't participate with group discussion but was able to sit quietly.  Sitter remains for safety.  Encouraged continued participation in group and unit activities.  We will continue to monitor the progress towards her goals.  Tracy Gross remains safe on the unit.

## 2015-11-03 NOTE — [facility] Group Notes (Signed)
[facility] Group Notes:  (Counselor/Nursing/MHT/Case Management/Adjunct)  11/03/2015 1:15PM  Type of Therapy:  Group Therapy  Participation Level:  Active  Participation Quality:  Appropriate  Affect:  Flat  Cognitive:  Oriented  Insight:  Improving  Engagement in Group:  Limited  Engagement in Therapy:  Limited  Modes of Intervention:  Discussion, Exploration and Socialization  Summary of Progress/Problems: The topic for group was balance in life.  Pt participated in the discussion about when their life was in balance and out of balance and how this feels.  Pt discussed ways to get back in balance and short term goals they can work on to get where they want to be. Invited.  Chose to not attend.   Ida Rogue 11/03/2015 3:37 PM

## 2015-11-03 NOTE — Progress Notes (Signed)
1:1 Tracy Gross has been asleep for a few hours now. She is safe on the unit. Sitter remains at bedside. Will continue to monitor for patient safety and any changes in behavior.

## 2015-11-04 DIAGNOSIS — F410 Panic disorder [episodic paroxysmal anxiety] without agoraphobia: Secondary | ICD-10-CM | POA: Clinically undetermined

## 2015-11-04 LAB — URINE CULTURE: Culture: >=100000

## 2015-11-04 MED ORDER — CITALOPRAM HYDROBROMIDE 10 MG PO TABS
10.0000 mg | ORAL_TABLET | Freq: Every day | ORAL | Status: DC
  Administered 2015-11-05: 10 mg via ORAL
  Filled 2015-11-04 (×3): qty 1

## 2015-11-04 NOTE — Progress Notes (Signed)
[facility] Post 1:1 Observation Documentation  For the first (8) hours following discontinuation of 1:1 precautions, a progress note entry by nursing staff should be documented at least every 2 hours, reflecting the patient's behavior, condition, mood, and conversation.  Use the progress notes for additional entries.  Time 1:1 discontinued:  1600  Patient's Behavior:  Tracy Gross is in her room with visitors at the bedside.   Patient's Condition:  No signs of distress noted.   Patient's Conversation:  Pt is talking with her family. Pt is smiling during their interaction.   Kate Larock A 11/04/2015, 8:48 PM

## 2015-11-04 NOTE — Progress Notes (Addendum)
[facility] Post 1:1 Observation Documentation  For the first (8) hours following discontinuation of 1:1 precautions, a progress note entry by nursing staff should be documented at least every 2 hours, reflecting the patient's behavior, condition, mood, and conversation.  Use the progress notes for additional entries.  Time 1:1 discontinued:  1600  Patient's Behavior:  Tracy Gross has been in the day room with peers and noted in the hallway coloring.  She continues to be confused at times and asks repeatedly if she is going home.    Patient's Condition:  She denies SI/HI or A/V hallucinations.  She is pleasant and cooperative.  Affect flat.  She takes her medications without difficulty.   Patient's Conversation:  She was talking about how she likes to color her pictures.  Encouraged continued participation in group and unit activities.  Q 15 minute checks maintained for safety.  We will continue to monitor the progress towards her goals.  Norm Parcel Omarri Eich 11/04/2015, 6:45 PM

## 2015-11-04 NOTE — Progress Notes (Signed)
[facility] Post 1:1 Observation Documentation  For the first (8) hours following discontinuation of 1:1 precautions, a progress note entry by nursing staff should be documented at least every 2 hours, reflecting the patient's behavior, condition, mood, and conversation.  Use the progress notes for additional entries.  Time 1:1 discontinued:  1600  Patient's Behavior:  Sitting quietly in the day room.  She appropriate interaction with staff and peers.  Patient's Condition:  She continues to exhibit confusion at times but is able to be redirected.  No issues with trying to leave the unit.  She denies any SI/HI or A/V hallucinations.    Patient's Conversation:  She continues to be focused on trying to leave and still has to be reminded that she is not leaving today.  Q 15 minute checks maintained for safety.  We will continue to monitor the progress towards her goals.  Norm Parcel Kobee Medlen 11/04/2015, 4:16 PM

## 2015-11-04 NOTE — Progress Notes (Signed)
RN 1:1 Note D: Pt is sitting at the bedside with her eyes closed. Respirations even and unlabored. Pt appears to be in no signs of distress at this time. A: 1:1 observation remains for pt's safety. R: Pt remains safe at this time.

## 2015-11-04 NOTE — [facility] Group Notes (Signed)
[facility] LCSW Group Therapy  11/04/2015 1:26 PM  Type of Therapy: Group Therapy  Participation Level: Active  Participation Quality: Attentive  Affect: Flat  Cognitive: Oriented  Insight: Limited  Engagement in Therapy: Engaged  Modes of Intervention: Discussion and Socialization  Summary of Progress/Problems: Onalee Hua from [employer] was here to tell his story of recovery and play his guitar. Stayed for the entire time. Seemed interested in the topic and engaged appropriately with the speaker.   Vito Backers. Beverely Pace 11/04/2015 1:26 PM

## 2015-11-04 NOTE — [facility] Group Notes (Signed)
[facility] LCSW Aftercare Discharge Planning Group Note   11/04/2015 12:39 PM  Participation Quality:  Active  Mood/Affect:  Appropriate  Depression Rating:    Anxiety Rating:    Thoughts of Suicide:  No Will you contract for safety?   NA  Current AVH:  No  Plan for Discharge/Comments:  Pt reports that she is feeling good and a lot better than before. Pt reports that she is sleeping well and her appetite has been ok. Pt is interested in services in [place], [place].  Transportation Means:   Supports:  Jonathon Jordan

## 2015-11-04 NOTE — Progress Notes (Signed)
[facility] MD Progress Note  11/04/2015 12:58 PM Brelynn Wheller  MRN:  [id number] Subjective:  Patient states " I still feel like I am going to die .'   Objective:Sharona Rubel is a 25 y.o. female, single, Caucasian , employed , lives with her partner , who presented to [facility] accompanied by her parents for worsening anxiety sx.  Patient seen and chart reviewed.Discussed patient with treatment team.  Pt today with anxiety sx, racing heart rate on and off , unable to identify triggers. Pt otherwise denies other sx. Per staff - pt continues to be intrusive sometimes - has difficulty finding her room. Pt is however redirectable. This AM - pt appears alert, oriented to person , place, time, is able to state her address as well as phone number correctly. Will change 1;1 precaution to continuous OBS - will reassess.      Principal Problem: Brief psychotic disorder Diagnosis:   Patient Active Problem List   Diagnosis Date Noted  . Panic disorder [F41.0] 11/04/2015  . Adjustment disorder with mixed anxiety and depressed mood [F43.23] 11/01/2015  . Brief psychotic disorder [F23] 11/01/2015  . UTI (urinary tract infection) [N39.0] 11/01/2015   Total Time spent with patient: 30 minutes  Past Psychiatric History: Pt was recently started on Lexapro for anxiety sx, but stopped taking due to side effects. Denies hx of mental illness or being in an inpatient hospital in the past.Denies hx of suicide attempts.   Past Medical History: Pt denies hx of medical issues like Thyroid sx, HTN,DM Family History: Pt denies hx of HTN, DM, Thyroid issues in family.  Family History  Problem Relation Age of Onset  . Mental illness Neg Hx    Family Psychiatric History: Pt denies hx of mental illness, substance abuse or suicide in family. 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   History  Alcohol Use  . Yes    Comment: occasional      History  Drug Use No    Social History   Social History  . Marital Status: Single    Spouse Name: N/A  . Number of Children: N/A  . Years of Education: N/A   Social History Main Topics  . Smoking status: Never Smoker   . Smokeless tobacco: None  . Alcohol Use: Yes     Comment: occasional   . Drug Use: No  . Sexual Activity: Not Asked   Other Topics Concern  . None   Social History Narrative   Additional Social History:    Pain Medications: n/a Name of Substance 1: n/a                  Sleep: Fair  Appetite:  Good  Current Medications: Current Facility-Administered Medications  Medication Dose Route Frequency Provider Last Rate Last Dose  . acetaminophen (TYLENOL) tablet 650 mg  650 mg Oral Q6H PRN Jomarie Longs, MD   650 mg at 11/01/15 1605  . alum & mag hydroxide-simeth (MAALOX/MYLANTA) 200-200-20 MG/5ML suspension 30 mL  30 mL Oral Q4H PRN Jomarie Longs, MD      . benztropine (COGENTIN) tablet 0.5 mg  0.5 mg Oral BID Jomarie Longs, MD   0.5 mg at 11/04/15 0748  . haloperidol (HALDOL) tablet 5 mg  5 mg Oral Q6H PRN Jomarie Longs, MD   5 mg at  11/03/15 2053   And  . benztropine (COGENTIN) tablet 1 mg  1 mg Oral Q6H PRN Jomarie Longs, MD   1 mg at 11/03/15 2052  . cephALEXin (KEFLEX) capsule 500 mg  500 mg Oral 4 times per day Jomarie Longs, MD   500 mg at 11/04/15 1201  . [START ON 11/05/2015] citalopram (CELEXA) tablet 10 mg  10 mg Oral Daily Karsynn Deweese, MD      . haloperidol (HALDOL) tablet 1 mg  1 mg Oral TID Jomarie Longs, MD   1 mg at 11/04/15 1202  . hydrOXYzine (ATARAX/VISTARIL) tablet 25 mg  25 mg Oral Q6H PRN Jomarie Longs, MD   25 mg at 11/02/15 1610  . magnesium hydroxide (MILK OF MAGNESIA) suspension 30 mL  30 mL Oral Daily PRN Jomarie Longs, MD      . traZODone (DESYREL) tablet 100 mg  100 mg Oral QHS PRN Worthy Flank, NP   100 mg at 11/03/15  2050    Lab Results:  No results found for this or any previous visit (from the past 48 hour(s)).  Physical Findings: AIMS: Facial and Oral Movements Muscles of Facial Expression: None, normal Lips and Perioral Area: None, normal Jaw: None, normal Tongue: None, normal,Extremity Movements Upper (arms, wrists, hands, fingers): None, normal Lower (legs, knees, ankles, toes): None, normal, Trunk Movements Neck, shoulders, hips: None, normal, Overall Severity Severity of abnormal movements (highest score from questions above): None, normal Incapacitation due to abnormal movements: None, normal Patient's awareness of abnormal movements (rate only patient's report): No Awareness, Dental Status Current problems with teeth and/or dentures?: No Does patient usually wear dentures?: No  CIWA:  CIWA-Ar Total: 7 COWS:  COWS Total Score: 2  Musculoskeletal: Strength & Muscle Tone: within normal limits Gait & Station: normal Patient leans: N/A  Psychiatric Specialty Exam: Review of Systems  Psychiatric/Behavioral: Positive for depression. Negative for suicidal ideas and hallucinations. The patient is nervous/anxious.   All other systems reviewed and are negative.   Blood pressure 113/77, pulse 105, temperature 98.6 F (37 C), temperature source Oral, resp. rate 12, height  (1.626 m), weight 86.183 kg (190 lb), last menstrual period 11/01/2015, SpO2 100 %.Body mass index is 32.6 kg/(m^2).  General Appearance: Fairly Groomed  Patent attorney::  Good  Speech:  Clear and Coherent  Volume:  Decreased  Mood:  Anxious  Affect:  Congruent  Thought Process:  Goal Directed  Orientation:  Full (Time, Place, and Person)  Thought Content:  Paranoid Ideation and Rumination improving  Suicidal Thoughts:  No feels like she is going to die , likely anxiety provoked  Homicidal Thoughts:  No  Memory:  Immediate;   Fair Recent;   Fair Remote;   Fair  Judgement:  Impaired  Insight:  Lacking   Psychomotor Activity:  Restlessness  Concentration:  Poor  Recall:  Poor  Fund of Knowledge:Poor  Language: Good  Akathisia:  No  Handed:  Right  AIMS (if indicated):     Assets:  Communication Skills Desire for Improvement Resilience Social Support  ADL's:  Intact  Cognition: WNL  Sleep:  Number of Hours: 6.25      Treatment Plan Summary:Patient with no past hx of mental illness, reported recent onset of anxiety , reports several psychosocial stressors building up over the past year , several deaths in family, working two jobs, relational issues with partner.  Patient also with UTI -  on Keflex- unknown if this is also causing some of her confusion/psychosis .  Patient today with anxiety sx. Will continue treatment. Daily contact with patient to assess and evaluate symptoms and progress in treatment and Medication management   Change 1;1 precaution to continuous OBS .Will reassess. Will continue Haldol 1 mg po tid for psychosis. Will continue Cogentin 0.5 mg po bid for EPS. Will increase Celexa to 10 mg po daily for affective sx. Will continue Trazodone 100 mg po qhs for sleep. Continue Vistaril 25 mg po q6h prn for severe anxiety sx.  Continue Keflex 500 mg po qid for UTI. Continue Haldol 5 mg po prn for agitation/psychosis. Will continue to monitor vitals ,medication compliance and treatment side effects while patient is here.  Will monitor for medical issues as well as call consult as needed.  Reviewed labs ,UA - UTI , TSH - wnl, UDS- negative , CBC-wnl, bmp -wnl, CT scan - wnl.  CSW will start working on disposition. Patient to participate in therapeutic milieu .  Grady Mohabir MD 11/04/2015, 12:58 PM

## 2015-11-04 NOTE — Progress Notes (Signed)
D: Pt presents blunted in affect and anxious in mood. Pt denies any SI/HI/CAVH. Pt endorses racing thoughts. Pt is positive for though-blocking. Pt wanders the unit in a confused state. Pt is easily redirectable.  A: Writer administered scheduled and prn medications to pt, per MD orders. Continued support and availability as needed was extended to this pt. Staff continues to monitor pt with q70min checks.  R: No adverse drug reactions noted. Pt receptive to treatment. Pt remains safe at this time.

## 2015-11-04 NOTE — Progress Notes (Signed)
Close Observation DAR Note: Charlynn has been changed from 1:1 to close observation.  Timira has been walking around the unit talking about how she feels better.  She denies any SI/HI or A/V hallucinations.  Affect remains flat.  She was able to answer questions appropriately.  She denies any pain or discomfort.   She has attended AM group but when to lay down so she will be able to go to the cafeteria to eat.  No attempts to wander in peers rooms or attempting to walk off the unit.  Close observation continues with sitter within eyesight.   Encouraged participation in group and unit activities.  We will continue to monitor the progress towards her goals.

## 2015-11-04 NOTE — Progress Notes (Signed)
Close Observation DAR Note: Tracy Gross remains on close observation.  She was able to eat in the gym for lunch.  She was appropriate while off the unit.  She continues to have confused episodes.  She went to lay down and told staff "good night." She got up 3 minutes later and said "good morning."  She was more confused this afternoon and she kept asking what is the date.  She continues to talk about going home this afternoon and has a friend that is picking her up despite multiple redirection attempts.  She denies any SI/HI or A/V hallucinations.  She completed her self inventory and reports that her depression, hopelessness and anxiety 0/10.  She states that her goal for today is "routine" and she will accomplish this goal by "timeliness."   She denies any pain or discomfort and appears to be in no physical distress.  Close observation maintained for safety.  We will continue to monitor the progress towards her goals.  [name] remains safe on the unit.

## 2015-11-04 NOTE — Progress Notes (Signed)
RN 1:1 Note D: Pt in bed resting with eyes closed. Respirations even and unlabored. Pt appears to be in no signs of distress at this time. A: 1:1 observation remains for this pt's safety. R: Pt remains safe at this time.   

## 2015-11-04 NOTE — Progress Notes (Signed)
Adult Psychoeducational Group Note  Date:  11/04/2015 Time:  8:27 PM  Group Topic/Focus:  Wrap-Up Group:   The focus of this group is to help patients review their daily goal of treatment and discuss progress on daily workbooks.  Participation Level:  Active  Participation Quality:  Appropriate  Affect:  Appropriate  Cognitive:  Appropriate  Insight: Good  Engagement in Group:  Engaged  Modes of Intervention:  Discussion  Additional Comments:  Pt was pleasant during wrap-up group. Pt rated her day a 7 out of 10 because she achieved her goal for the day, which was to reach out more and to be more outgoing. Pt was also happy that she got to see and talk to her family today.   Cleotilde Neer 11/04/2015, 8:38 PM

## 2015-11-04 NOTE — Progress Notes (Signed)
[facility] Post 1:1 Observation Documentation  For the first (8) hours following discontinuation of 1:1 precautions, a progress note entry by nursing staff should be documented at least every 2 hours, reflecting the patient's behavior, condition, mood, and conversation.  Use the progress notes for additional entries.  Time 1:1 discontinued:  1600  Patient's Behavior:  Pt in bed resting with eyes closed.   Patient's Condition:  Respirations even and unlabored. No signs of distress noted.    Patient's Conversation: Tracy Gross A 11/04/2015, 10:23 PM

## 2015-11-05 ENCOUNTER — Telehealth (HOSPITAL_COMMUNITY): Payer: Self-pay

## 2015-11-05 DIAGNOSIS — F323 Major depressive disorder, single episode, severe with psychotic features: Principal | ICD-10-CM

## 2015-11-05 MED ORDER — HALOPERIDOL 1 MG PO TABS
3.0000 mg | ORAL_TABLET | Freq: Two times a day (BID) | ORAL | Status: DC
  Administered 2015-11-05 – 2015-11-10 (×10): 3 mg via ORAL
  Filled 2015-11-05 (×3): qty 3
  Filled 2015-11-05: qty 42
  Filled 2015-11-05: qty 3
  Filled 2015-11-05: qty 42
  Filled 2015-11-05 (×8): qty 3

## 2015-11-05 MED ORDER — CITALOPRAM HYDROBROMIDE 20 MG PO TABS
20.0000 mg | ORAL_TABLET | Freq: Every day | ORAL | Status: DC
  Administered 2015-11-06 – 2015-11-10 (×5): 20 mg via ORAL
  Filled 2015-11-05 (×4): qty 1
  Filled 2015-11-05: qty 7
  Filled 2015-11-05 (×2): qty 1

## 2015-11-05 NOTE — [facility] Group Notes (Signed)
[facility] Group Notes:  (Nursing/MHT/Case Management/Adjunct)  Date:  11/05/2015  Time:  11:34 AM  Type of Therapy:  Nurse Education  Participation Level:  Active  Participation Quality:  Appropriate and Attentive  Affect:  Appropriate  Cognitive:  Alert and Appropriate  Insight:  Appropriate and Good  Engagement in Group:  Lacking  Modes of Intervention:  Discussion and Education Topic was on leisure and lifestyle changes. Discussed the importance of choosing a healthy leisure activities. Group encouraged to surround themselves with positive and healthy group/support system when changing to a healthy lifestyle.   Mickie Bail 11/05/2015, 11:34 AM

## 2015-11-05 NOTE — Tx Team (Signed)
Interdisciplinary Treatment Plan Update (Adult)  Date:  11/05/2015   Time Reviewed:  5:03 PM   Progress in Treatment: Attending groups: Intermittently Participating in groups:  No Taking medication as prescribed:  Yes. Tolerating medication:  Yes. Family/Significant other contact made:  No Patient understands diagnosis:  Yes  As evidenced by seeking help with "stress" Discussing patient identified problems/goals with staff:  Yes, see initial care plan. Medical problems stabilized or resolved:  Yes. Denies suicidal/homicidal ideation: Yes. Issues/concerns per patient self-inventory:  No. Other:  New problem(s) identified:  Discharge Plan or Barriers:  Reason for Continuation of Hospitalization: Anxiety Depression Medication stabilization Other; describe Paranoia, confusion  Comments:  Tracy Gross is an 25 y.o. female, single, Caucasian who presents to [facility] accompanied by her parents, who participated in assessment with Pt's consent. Pt reports she has felt confused over the past ten days. She states that she is "seeing messages in everything" and that "things don't follow." Pt acknowledges that she feels out of touch with reality. Pt's mother states that Pt works as an Glass blower/designer for a psychologist and her employer informed Pt's partner that Pt started behaving oddly ten days ago. Parents report Pt has appeared confused, stating she doesn't have a twin sister (which she does). Pt has been trying to go into stranger's cars, walking out of the house with no idea where she is going and other strange behaviors. Pt described her mood as "all over the place." Pt reports symptoms including crying spells, social withdrawal, loss of interest in usual pleasures, irritability, decreased concentration, erratic appetite and feelings of hopelessness. Pt reports she has not slept in days. Pt denies current suicidal ideation or history of suicidal gestures. She denies intentional  self-injurious behaviors. She denies homicidal ideation or history of violence. She says she rarely drinks alcohol and infrequently smokes marijuana. She reports last smoking marijuana two weeks ago and has no reason to believe the marijuana might have been laced with another substance. She denies other substance use.  Pt identifies work and relationships as stressors. When asked about problems in her relationship she replies "overcaring." Pt lives with a female partner in [place], [place] and parents brought her to [place] they [place] to assist her. Pt denies any history of abuse or trauma. Pt's mother says Pt may be grieving the death of her grandmother and great grandmother, both of whom died in Dec 15, 2014. Pt's denies any family history of mental health problems or substance abuse. When asked who in her life is supportive, Pt replies "nobody" but Pt's parents appear concerned and supportive. Pt has no history of inpatient or outpatient mental health or substance abuse treatment. She denies any history of being prescribed psychiatric medication. Patient with no past hx of mental illness, reported recent onset of anxiety , reports several psychosocial stressors building up over the past year , several deaths in family, working two jobs, relational issues with partner. Pt currently denies any psychosis.  Patient also with UTI - on Keflex- unknown if this is also causing some of her confusion/psychosis . Patient today is agreeable to starting low dose Haldol with Celexa. Continue 1;1 precaution for safety. Will start a trial of Haldol 1 mg po tid for psychosis. Will add Cogentin 0.5 mg po bid for EPS. Will add Celexa 5 mg po daily for affective sx. Will continue Trazodone 100 mg po qhs for sleep. Continue Vistaril 25 mg po q6h prn for severe anxiety sx. Continue Keflex 500 mg po qid  for UTI. Continue Haldol 5 mg po prn for agitation/psychosis.  11/05/15: Continues with confusion; wandering into other  patient's rooms and even putting on other's clothes "I feel like I'm dying."  Continue 1;1 precaution for safety reasons. Will increase Haldol to 3 mg po bid for psychosis. Will continue Cogentin 0.5 mg po bid for EPS. Will increase Celexa to 20 mg po daily for affective sx. Will continue Trazodone 100 mg po qhs for sleep. Continue Vistaril 25 mg po q6h prn for severe anxiety sx.  Continue Keflex 500 mg po qid for UTI. Continue Haldol 5 mg po prn for agitation/psychosis.  Estimated length of stay: 4-5 days  New goal(s):  Review of initial/current patient goals per problem list:   Review of initial/current patient goals per problem list:  1. Goal(s): Patient will participate in aftercare plan   Met: Yes   Target date: 3-5 days post admission date   As evidenced by: Patient will participate within aftercare plan AEB aftercare provider and housing plan at discharge being identified. 11/03/15:  Return home, follow up outpt   2. Goal (s): Patient will exhibit decreased depressive symptoms and suicidal ideations.   Met: Yes   Target date: 3-5 days post admission date   As evidenced by: Patient will utilize self rating of depression at 3 or below and demonstrate decreased signs of depression or be deemed stable for discharge by MD. 11/03/15:  Pt rates her depression a 3 today     3. Goal(s): Patient will demonstrate decreased signs and symptoms of anxiety.   Met: Yes   Target date: 3-5 days post admission date   As evidenced by: Patient will utilize self rating of anxiety at 3 or below and demonstrated decreased signs of anxiety, or be deemed stable for discharge by MD 11/03/15:  Rates anxiety at a 3 today     5. Goal(s): Patient will demonstrate decreased signs of psychosis  * Met: No  * Target date: 3-5 days post admission date  * As evidenced by: Patient will demonstrate decreased frequency of AVH or return to baseline function 11/03/15:  Pt reports  paranoia, confusion 11/05/15:  Symptoms persist       Attendees: Patient:  11/05/2015 5:03 PM   Family:   11/05/2015 5:03 PM   Physician:  Ursula Alert, MD 11/05/2015 5:03 PM   Nursing:   Hedy Jacob RN 11/05/2015 5:03 PM   CSW:    Roque Lias, LCSW   11/05/2015 5:03 PM   Other:  11/05/2015 5:03 PM   Other:   11/05/2015 5:03 PM   Other:  Lars Pinks, Nurse CM 11/05/2015 5:03 PM   Other:   11/05/2015 5:03 PM   Other:  Norberto Sorenson, [place]  11/05/2015 5:03 PM   Other:  11/05/2015 5:03 PM   Other:  11/05/2015 5:03 PM   Other:  11/05/2015 5:03 PM   Other:  11/05/2015 5:03 PM   Other:  11/05/2015 5:03 PM   Other:   11/05/2015 5:03 PM    Scribe for Treatment Team:   Trish Mage, 11/05/2015 5:03 PM

## 2015-11-05 NOTE — Progress Notes (Signed)
Pt activated the call bell button. Staff arrives promptly. " I feel like I'm dying". Pt was oriented back to reality by staff. Pt informed that staff will continue to check on her q19min checks. Pt's door left open to provide Pt encouraged to get some rest.

## 2015-11-05 NOTE — Progress Notes (Signed)
[facility] MD Progress Note  11/05/2015 12:07 PM Tracy Gross  MRN:  [id number] Subjective:  Patient states " I feel I am thinking more clearly now. I was confused last night after my partner left. When she left I felt sad that I could not go with her . I was trying to prove a point when I went and tried on the other patient's pants. I am sorry that I did that.'     Objective:Tracy Gross is a 25 y.o. female, single, Caucasian , employed , lives with her partner , who presented to [facility] accompanied by her parents for worsening anxiety sx.  Patient seen and chart reviewed.Discussed patient with treatment team.  Pt today seen as pleasant , alert, oriented x3. Pt also able to state her DOB as well as address and knows her room # - does not appear to be confused at this time. Pt has clear memory of what she did last night - per staff she was confused and went in to another female patient's room and tried on his pants. Pt apologizes for her behavior and states she was trying to prove a point. Pt today reports depressive sx for a long time prior to admission, she also reports anhedonia , sleep issues , loss of appetite and energy. Pt had denied all these sx until now. Unknown , if her current sx are due to worsening depression. She also has a UTI , which could be contributing to some of her cognitive sx. Currently on antibiotics - denies any sx.       Principal Problem: MDD (major depressive disorder), single episode, severe with psychotic features (HCC) Diagnosis:   Patient Active Problem List   Diagnosis Date Noted  . MDD (major depressive disorder), single episode, severe with psychotic features (HCC) [F32.3] 11/05/2015  . UTI (urinary tract infection) [N39.0] 11/01/2015   Total Time spent with patient: 30 minutes  Past Psychiatric History: Pt was recently started on Lexapro for anxiety sx, but stopped taking due to side effects. Denies hx of mental illness or being in an inpatient  hospital in the past.Denies hx of suicide attempts.   Past Medical History: Pt denies hx of medical issues like Thyroid sx, HTN,DM Family History: Pt denies hx of HTN, DM, Thyroid issues in family.  Family History  Problem Relation Age of Onset  . Mental illness Neg Hx    Family Psychiatric History: Pt denies hx of mental illness, substance abuse or suicide in family. 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   History  Alcohol Use  . Yes    Comment: occasional      History  Drug Use No    Social History   Social History  . Marital Status: Single    Spouse Name: N/A  . Number of Children: N/A  . Years of Education: N/A   Social History Main Topics  . Smoking status: Never Smoker   . Smokeless tobacco: None  . Alcohol Use: Yes     Comment: occasional   . Drug Use: No  . Sexual Activity: Not Asked   Other Topics Concern  . None   Social History Narrative   Additional Social History:    Pain Medications: n/a Name of Substance 1: n/a  Sleep: Fair  Appetite:  Good  Current Medications: Current Facility-Administered Medications  Medication Dose Route Frequency Provider Last Rate Last Dose  . acetaminophen (TYLENOL) tablet 650 mg  650 mg Oral Q6H PRN Jomarie Longs, MD   650 mg at 11/01/15 1605  . alum & mag hydroxide-simeth (MAALOX/MYLANTA) 200-200-20 MG/5ML suspension 30 mL  30 mL Oral Q4H PRN Jomarie Longs, MD      . benztropine (COGENTIN) tablet 0.5 mg  0.5 mg Oral BID Jomarie Longs, MD   0.5 mg at 11/05/15 0834  . haloperidol (HALDOL) tablet 5 mg  5 mg Oral Q6H PRN Jomarie Longs, MD   5 mg at 11/05/15 0105   And  . benztropine (COGENTIN) tablet 1 mg  1 mg Oral Q6H PRN Jomarie Longs, MD   1 mg at 11/05/15 0104  . cephALEXin (KEFLEX) capsule 500 mg  500 mg Oral 4 times per day  Jomarie Longs, MD   500 mg at 11/05/15 0511  . [START ON 11/06/2015] citalopram (CELEXA) tablet 20 mg  20 mg Oral Daily Manish Ruggiero, MD      . haloperidol (HALDOL) tablet 3 mg  3 mg Oral BID Jomarie Longs, MD      . hydrOXYzine (ATARAX/VISTARIL) tablet 25 mg  25 mg Oral Q6H PRN Jomarie Longs, MD   25 mg at 11/05/15 0514  . magnesium hydroxide (MILK OF MAGNESIA) suspension 30 mL  30 mL Oral Daily PRN Jomarie Longs, MD      . traZODone (DESYREL) tablet 100 mg  100 mg Oral QHS PRN Worthy Flank, NP   100 mg at 11/05/15 0105    Lab Results:  No results found for this or any previous visit (from the past 48 hour(s)).  Physical Findings: AIMS: Facial and Oral Movements Muscles of Facial Expression: None, normal Lips and Perioral Area: None, normal Jaw: None, normal Tongue: None, normal,Extremity Movements Upper (arms, wrists, hands, fingers): None, normal Lower (legs, knees, ankles, toes): None, normal, Trunk Movements Neck, shoulders, hips: None, normal, Overall Severity Severity of abnormal movements (highest score from questions above): None, normal Incapacitation due to abnormal movements: None, normal Patient's awareness of abnormal movements (rate only patient's report): No Awareness, Dental Status Current problems with teeth and/or dentures?: No Does patient usually wear dentures?: No  CIWA:  CIWA-Ar Total: 7 COWS:  COWS Total Score: 2  Musculoskeletal: Strength & Muscle Tone: within normal limits Gait & Station: normal Patient leans: N/A  Psychiatric Specialty Exam: Review of Systems  Psychiatric/Behavioral: Positive for depression. Negative for suicidal ideas and hallucinations. The patient is nervous/anxious.   All other systems reviewed and are negative.   Blood pressure 135/80, pulse 121, temperature 97.6 F (36.4 C), temperature source Oral, resp. rate 20, height  (1.626 m), weight 86.183 kg (190 lb), last menstrual period 11/01/2015, SpO2 100 %.Body mass  index is 32.6 kg/(m^2).  General Appearance: Fairly Groomed  Patent attorney::  Good  Speech:  Clear and Coherent  Volume:  Decreased  Mood:  Anxious and Depressed  Affect:  Congruent  Thought Process:  Goal Directed  Orientation:  Full (Time, Place, and Person)  Thought Content:  Paranoid Ideation and Rumination improving  Suicidal Thoughts:  No   Homicidal Thoughts:  No  Memory:  Immediate;   Fair Recent;   Fair Remote;   Fair  Judgement:  Impaired  Insight:  Lacking  Psychomotor Activity:  Restlessness  Concentration:  Poor  Recall:  Poor  Fund of Knowledge:Poor  Language: Good  Akathisia:  No  Handed:  Right  AIMS (if indicated):     Assets:  Communication Skills Desire for Improvement Resilience Social Support  ADL's:  Intact  Cognition: WNL  Sleep:  Number of Hours: 4.25      Treatment Plan Summary:Patient with no past hx of mental illness, reported recent onset of anxiety , reports several psychosocial stressors building up over the past year , several deaths in family, working two jobs, relational issues with partner.  Patient also with UTI -  on Keflex- unknown if this is also causing some of her confusion/psychosis . Patient today with anxiety as well as depressive sx .Will continue treatment. Daily contact with patient to assess and evaluate symptoms and progress in treatment and Medication management   Continue 1;1 precaution for safety reasons. Will increase Haldol to 3 mg po bid for psychosis. Will continue Cogentin 0.5 mg po bid for EPS. Will increase Celexa to 20  mg po daily for affective sx. Will continue Trazodone 100 mg po qhs for sleep. Continue Vistaril 25 mg po q6h prn for severe anxiety sx.  Continue Keflex 500 mg po qid for UTI. Continue Haldol 5 mg po prn for agitation/psychosis. Will continue to monitor vitals ,medication compliance and treatment side effects while patient is here.  Will monitor for medical issues as well as call consult as  needed.  Reviewed labs ,UA - UTI , TSH - wnl, UDS- negative , CBC-wnl, bmp -wnl, CT scan - wnl.  Recreational therapy consult. CSW will start working on disposition. Patient to participate in therapeutic milieu .  Mahesh Sizemore MD 11/05/2015, 12:07 PM

## 2015-11-05 NOTE — Progress Notes (Signed)
D: Pt presented with flat affect, depressed mood and poor eye contact on initial this AM. Denied SI, HI, AVH and pain at time of assessment. Attended scheduled groups this AM and was engaged. Pt continue to need verbal redirections related to impulsive behavior and safety need on unit. Compliant with medications when offered.  A: Support and availability offered. All medications administered as per [facility]. 1:1 observation continues for safety without gestures of self harm to note thus far.  R: Pt receptive to care. Denies adverse drug reactions. Remains safe on unit.

## 2015-11-05 NOTE — Progress Notes (Signed)
D: Pt visible in milieu and in bedroom at intervals this afternoon without behavioral outburst or self harm gestures to note at this time. Affect and mood congruent. A: Support and redirection offered on as need basis. 1:1 observation level maintained for safety without events.  R: Pt receptive care. Remains safe on unit. POC continues.

## 2015-11-05 NOTE — Plan of Care (Signed)
Problem: Alteration in mood Goal: STG-Patient reports thoughts of self-harm to staff Outcome: Progressing Pt denies SI this shift in contrast to previous shift. 1:1 observation level maintained for safety without incident of self injurious behavior to report at this time.

## 2015-11-05 NOTE — Progress Notes (Signed)
D: Pt had to be verbally redirected to not sit behind nursing station due to HIPPA reasons and she tolerated it well. Compliant with medication throughout this shift. Reports feeling safe at this time without thoughts of self harm.  A: Continued support, availability and encouragement offered. 1:1 observation maintained as per MD's orders.  R: Pt receptive to care at present. POC continues. No physical distress evident at this time.

## 2015-11-05 NOTE — Progress Notes (Signed)
Pt pacing the halls with her eyes half closed. Pt attempted to make a phone call. Pt informed of phone hours. Pt walked back to her room.   Pt continued to walk in and out of her room. Pt required constant redirection by staff. Pt requesting to call 911. "I'm dying". No signs of physical distress displayed.  Writer sat in pt's room for one hour. Pt feel asleep within one hour of writer's presence in her room.

## 2015-11-05 NOTE — Progress Notes (Signed)
[facility] Post 1:1 Observation Documentation  For the first (8) hours following discontinuation of 1:1 precautions, a progress note entry by nursing staff should be documented at least every 2 hours, reflecting the patient's behavior, condition, mood, and conversation.  Use the progress notes for additional entries.  Time 1:1 discontinued:  1600  Patient's Behavior:  Pt in bed resting with eyes closed.   Patient's Condition:  Respirations even and unlabored.  Patient's Conversation:  August Luz, Teletha Petrea A 11/05/2015, 12:16 AM

## 2015-11-05 NOTE — [facility] Group Notes (Signed)
[facility] LCSW Group Therapy  11/05/2015 1:15 pm  Type of Therapy: Process Group Therapy  Participation Level:  Active  Participation Quality:  Appropriate  Affect:  Flat  Cognitive:  Oriented  Insight:  Improving  Engagement in Group:  Limited  Engagement in Therapy:  Limited  Modes of Intervention:  Activity, Clarification, Education, Problem-solving and Support  Summary of Progress/Problems: Today's group addressed the issue of overcoming obstacles.  Patients were asked to identify their biggest obstacle post d/c that stands in the way of their on-going success, and then problem solve as to how to manage this. "I realized my biggest obstacle is me-putting my own self down.  I have great supports and I know they are here for me.  I guess I lack confidence in social situations.  Everything goes back to me.  Me, me, me."  Later, decided that others could be more supportive.  Difficult to follow.  Disorganized.  Vague. Ralked abut a vision board with a color key.  Daryel Gerald B 11/05/2015   1:30 PM

## 2015-11-05 NOTE — Telephone Encounter (Signed)
Post ED Visit - Positive Culture Follow-up  Culture report reviewed by antimicrobial stewardship pharmacist:   Enzo Bi, Pharm.D.  Celedonio Miyamoto, Pharm.D., BCPS  Garvin Fila, Pharm.D.  Georgina Pillion, Pharm.D., BCPS  [place], [address].D., BCPS, AAHIVP  Estella Husk, Pharm.D., BCPS, AAHIVP  Tennis Must, Pharm.D.  Sherle Poe, [address].DJacinto Reap, Pharm.D.  Positive urine culture, >/= 100,000 colonies -> E Coli Dc'd to [facility] currently on Keflex -> sensitive to the same  Arvid Right 11/05/2015, 1:22 PM

## 2015-11-05 NOTE — Progress Notes (Signed)
Pt did not attend wrap up group meeting.  

## 2015-11-05 NOTE — Progress Notes (Signed)
1:1  Tracy Gross remains confused. Could not remember speaking with the physician this morning. Also endorses increased paranoia and passive SI. She states she believes others are thinking in their mind about her. She rates anxiety 7/10. She speaks the entire time to this Clinical research associate with her eyes closed. She is asking what time is lunch today. I instructed her that it is nighttime and the hours of breakfast and lunch for tomorrow. She verbalized understanding. Will continue to monitor for patient safety and medication effectiveness. She continues to endorse passive SI.

## 2015-11-05 NOTE — Plan of Care (Signed)
Problem: Alteration in thought process Goal: STG-Patient is able to follow short directions Outcome: Progressing Pt is verbally redirectable at present. Cooperative with unit routines and compliant with current treatment regimen. 1:1 maintained for safety without outburst.

## 2015-11-06 NOTE — Progress Notes (Signed)
Pt has been calm and cooperative in the unit, pt went for lunch and came back without any problems. Pt stated goal for today is " be delightfully persistent to the point of being obnoxious." No complain at this time, will continue to monitor.

## 2015-11-06 NOTE — Progress Notes (Signed)
[facility] Group Notes:  (Nursing/MHT/Case Management/Adjunct)  Date:  11/06/2015  Time:  9:40 PM  Type of Therapy:  Psychoeducational Skills  Participation Level:  Active  Participation Quality:  Appropriate  Affect:  Appropriate  Cognitive:  Appropriate  Insight:  Good  Engagement in Group:  Engaged  Modes of Intervention:  Education  Summary of Progress/Problems: Patient described her day as having been a 10 out of 10. She states that she had a good visit with her family and that she attended all of her groups as well. The patient's relapse prevention strategy (theme of the day) is to make her own appointments and she verbalized that she has a gynecology appointment already scheduled.   Hazle Coca S 11/06/2015, 9:40 PM

## 2015-11-06 NOTE — Progress Notes (Signed)
[facility] MD Progress Note  11/06/2015 10:45 AM Tracy Gross  MRN:  [id number] Subjective:  Patient states " I feel better today. I am not as confused as yesterday.I need a vaginal exam for preventive care.'     Objective:Ronika Stuckey is a 25 y.o. female, single, Caucasian , employed , lives with her partner , who presented to [facility] accompanied by her parents for worsening anxiety sx.  Patient seen and chart reviewed.Discussed patient with treatment team.  Pt today seen as less anxious , more oriented than yesterday. Pt reports that she is able to focus better today. Tolerating medications well. Per staff - pt continues to have periods when she is paranoid , appears confused and doing irrelevant things , being intrusive , being confused about her room. However , she is progressing - seems to be more alert and cooperative this AM.  Hence will discontinue 1;1 precaution - will observe pt on the unit today. Pt encouraged to attend groups and participate well.      Principal Problem: MDD (major depressive disorder), single episode, severe with psychotic features (HCC) Diagnosis:   Patient Active Problem List   Diagnosis Date Noted  . MDD (major depressive disorder), single episode, severe with psychotic features (HCC) [F32.3] 11/05/2015  . UTI (urinary tract infection) [N39.0] 11/01/2015   Total Time spent with patient: 30 minutes  Past Psychiatric History: Pt was recently started on Lexapro for anxiety sx, but stopped taking due to side effects. Denies hx of mental illness or being in an inpatient hospital in the past.Denies hx of suicide attempts.   Past Medical History: Pt denies hx of medical issues like Thyroid sx, HTN,DM Family History: Pt denies hx of HTN, DM, Thyroid issues in family.  Family History  Problem Relation Age of Onset  . Mental illness Neg Hx    Family Psychiatric History: Pt denies hx of mental illness, substance abuse or suicide in family. 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   History  Alcohol Use  . Yes    Comment: occasional      History  Drug Use No    Social History   Social History  . Marital Status: Single    Spouse Name: N/A  . Number of Children: N/A  . Years of Education: N/A   Social History Main Topics  . Smoking status: Never Smoker   . Smokeless tobacco: None  . Alcohol Use: Yes     Comment: occasional   . Drug Use: No  . Sexual Activity: Not Asked   Other Topics Concern  . None   Social History Narrative   Additional Social History:    Pain Medications: n/a Name of Substance 1: n/a                  Sleep: Fair  Appetite:  Good  Current Medications: Current Facility-Administered Medications  Medication Dose Route Frequency Provider Last Rate Last Dose  . acetaminophen (TYLENOL) tablet 650 mg  650 mg Oral Q6H PRN Jomarie Longs, MD   650 mg at 11/01/15 1605  . alum & mag hydroxide-simeth (MAALOX/MYLANTA) 200-200-20 MG/5ML suspension 30 mL  30 mL Oral Q4H PRN Calyn Sivils, MD      . benztropine (COGENTIN) tablet 0.5 mg  0.5 mg Oral BID Jomarie Longs, MD   0.5 mg at  11/06/15 0835  . haloperidol (HALDOL) tablet 5 mg  5 mg Oral Q6H PRN Jomarie Longs, MD   5 mg at 11/05/15 0105   And  . benztropine (COGENTIN) tablet 1 mg  1 mg Oral Q6H PRN Jomarie Longs, MD   1 mg at 11/05/15 0104  . cephALEXin (KEFLEX) capsule 500 mg  500 mg Oral 4 times per day Jomarie Longs, MD   500 mg at 11/06/15 4098  . citalopram (CELEXA) tablet 20 mg  20 mg Oral Daily Macio Kissoon, MD   20 mg at 11/06/15 0836  . haloperidol (HALDOL) tablet 3 mg  3 mg Oral BID Jomarie Longs, MD   3 mg at 11/06/15 0835  . hydrOXYzine (ATARAX/VISTARIL) tablet 25 mg  25 mg Oral Q6H PRN Jomarie Longs, MD   25 mg at 11/05/15 2157  . magnesium hydroxide (MILK OF MAGNESIA)  suspension 30 mL  30 mL Oral Daily PRN Jomarie Longs, MD      . traZODone (DESYREL) tablet 100 mg  100 mg Oral QHS PRN Worthy Flank, NP   100 mg at 11/05/15 2156    Lab Results:  No results found for this or any previous visit (from the past 48 hour(s)).  Physical Findings: AIMS: Facial and Oral Movements Muscles of Facial Expression: None, normal Lips and Perioral Area: None, normal Jaw: None, normal Tongue: None, normal,Extremity Movements Upper (arms, wrists, hands, fingers): None, normal Lower (legs, knees, ankles, toes): None, normal, Trunk Movements Neck, shoulders, hips: None, normal, Overall Severity Severity of abnormal movements (highest score from questions above): None, normal Incapacitation due to abnormal movements: None, normal Patient's awareness of abnormal movements (rate only patient's report): No Awareness, Dental Status Current problems with teeth and/or dentures?: No Does patient usually wear dentures?: No  CIWA:  CIWA-Ar Total: 7 COWS:  COWS Total Score: 2  Musculoskeletal: Strength & Muscle Tone: within normal limits Gait & Station: normal Patient leans: N/A  Psychiatric Specialty Exam: Review of Systems  Psychiatric/Behavioral: Positive for depression. Negative for suicidal ideas and hallucinations. The patient is nervous/anxious.   All other systems reviewed and are negative.   Blood pressure 115/89, pulse 111, temperature 97.5 F (36.4 C), temperature source Oral, resp. rate 20, height  (1.626 m), weight 86.183 kg (190 lb), last menstrual period 11/01/2015, SpO2 100 %.Body mass index is 32.6 kg/(m^2).  General Appearance: Fairly Groomed  Patent attorney::  Good  Speech:  Clear and Coherent  Volume:  Decreased  Mood:  Anxious and Depressed  Affect:  Congruent  Thought Process:  Goal Directed, irrelevant at times  Orientation:  Full (Time, Place, and Person)  Thought Content:  Paranoid Ideation and Rumination on and off  Suicidal Thoughts:   No   Homicidal Thoughts:  No  Memory:  Immediate;   Fair Recent;   Fair Remote;   Fair  Judgement:  Impaired  Insight:  Lacking  Psychomotor Activity:  Restlessness  Concentration:  Poor  Recall:  Poor  Fund of Knowledge:Poor  Language: Good  Akathisia:  No  Handed:  Right  AIMS (if indicated):     Assets:  Communication Skills Desire for Improvement Resilience Social Support  ADL's:  Intact  Cognition: WNL  Sleep:  Number of Hours: 6.25      Treatment Plan Summary:Patient with no past hx of mental illness, reported recent onset of anxiety , reports several psychosocial stressors building up over the past year , several deaths in family, working two jobs, relational issues with partner.  Patient also with UTI -  on Keflex- unknown if this is also causing some of her confusion/psychosis . Patient today with anxiety as well as depressive sx .Will continue treatment. Daily contact with patient to assess and evaluate symptoms and progress in treatment and Medication management   Discontinue 1;1 precaution for safety reasons.Will observe on the unit and reassess. Will continue Haldol to 3 mg po bid for psychosis. Will continue Cogentin 0.5 mg po bid for EPS. Increased Celexa to 20  mg po daily for affective sx. Will continue Trazodone 100 mg po qhs for sleep. Continue Vistaril 25 mg po q6h prn for severe anxiety sx.  Continue Keflex 500 mg po qid for UTI. Continue Haldol 5 mg po prn for agitation/psychosis. Will continue to monitor vitals ,medication compliance and treatment side effects while patient is here.  Will monitor for medical issues as well as call consult as needed.  Reviewed labs ,UA - UTI , TSH - wnl, UDS- negative , CBC-wnl, bmp -wnl, CT scan - wnl.  Recreational therapy consult. CSW will start working on disposition. Patient to participate in therapeutic milieu .  Khye Hochstetler MD 11/06/2015, 10:45 AM

## 2015-11-06 NOTE — Progress Notes (Signed)
1:1  Tracy Gross remains asleep in her room. No distress noted. Sitter remains at bedside. Will continue to monitor.

## 2015-11-06 NOTE — [facility] Group Notes (Signed)
[facility] LCSW Group Therapy  11/06/2015  1:05 PM  Type of Therapy:  Group therapy  Participation Level:  Active  Participation Quality:  Attentive  Affect:  Flat  Cognitive:  Oriented  Insight:  Limited  Engagement in Therapy:  Limited  Modes of Intervention:  Discussion, Socialization  Summary of Progress/Problems:  Chaplain was here to lead a group on themes of hope and courage.  "Courage is the bility to ask others for help.  I feel like when I have a lot of supportive people around me, it is actually harder to ask for help."  Rather vague, still struggling with focus and concentration.  Daryel Gerald B 11/06/2015 1:23 PM

## 2015-11-06 NOTE — [facility] Suicide Risk Assessment (Signed)
[facility] INPATIENT:  Family/Significant Other Suicide Prevention Education  Suicide Prevention Education:  Education Completed; No one has been identified by the patient as the family member/significant other with whom the patient will be residing, and identified as the person(s) who will aid the patient in the event of a mental health crisis (suicidal ideations/suicide attempt).  With written consent from the patient, the family member/significant other has been provided the following suicide prevention education, prior to the and/or following the discharge of the patient.  The suicide prevention education provided includes the following:  Suicide risk factors  Suicide prevention and interventions  National Suicide Hotline telephone number  [facility] assessment telephone number  [facility] Emergency Assistance 911  [facility] and/or Residential Mobile Crisis Unit telephone number  Request made of family/significant other to:  Remove weapons (e.g., guns, rifles, knives), all items previously/currently identified as safety concern.    Remove drugs/medications (over-the-counter, prescriptions, illicit drugs), all items previously/currently identified as a safety concern.  The family member/significant other verbalizes understanding of the suicide prevention education information provided.  The family member/significant other agrees to remove the items of safety concern listed above. The patient did not endorse SI at the time of admission, nor did the patient c/o SI during the stay here.  SPE not required. However, I did talk to mother, Sissi Padia to confirm that pt will d/c to her, and to identify a crises plan  Ida Rogue 11/06/2015, 1:03 PM

## 2015-11-06 NOTE — Progress Notes (Signed)
Post 1:1 note Pt has been observed in the day room watching TV and interacting with both staff and peers. Pt is calm and denies SI/HI and contracted for safety, will continue to monitor.

## 2015-11-06 NOTE — Progress Notes (Signed)
Pt observed in the milieu interacting with peers. Pt has been calm and follow instructions, no complain at this time, will continue to monitor.

## 2015-11-06 NOTE — Progress Notes (Signed)
DAR: Patient is safe on the unit. Medications administered as prescribed. Continue to monitor for safety and medication effectiveness. Q 15 minute checks continue throughout the night.  

## 2015-11-06 NOTE — Progress Notes (Signed)
1:1 Patient is asleep and safe on the unit. No concerns or visible signs of agitation. Will continue to monitor. Sitter remains at bedside.

## 2015-11-06 NOTE — Progress Notes (Signed)
Post 1:1 Patient has been taken off 1:1 at 9 am. Pt is calm and cooperative with the at this time. Pt denies SI/HI and verbally contracted for safety. Pt is being monitored q 15 min checks for safety.

## 2015-11-07 NOTE — [facility] Group Notes (Signed)
[facility] Group Notes:  (Nursing/MHT/Case Management/Adjunct)  Date:  11/07/2015  Time:  1100  Type of Therapy:  Nurse Education  Participation Level:  Did Not Attend  Summary of Progress/Problems:  Dara Hoyer 11/07/2015, 2:40 PM

## 2015-11-07 NOTE — Progress Notes (Signed)
[facility] Group Notes:  (Nursing/MHT/Case Management/Adjunct)  Date:  11/07/2015  Time:  11:01 PM  Type of Therapy:  Psychoeducational Skills  Participation Level:  Minimal  Participation Quality:  Attentive  Affect:  Appropriate  Cognitive:  Appropriate  Insight:  Lacking  Engagement in Group:  Limited  Modes of Intervention:  Education  Summary of Progress/Problems: The patient described her day as having been "okay" and that she was grateful for all of the support that she has received. In terms of the theme of the day, her coping skill will be to work on her sense of humor and to laugh with others.   Daron Stutz S 11/07/2015, 11:01 PM

## 2015-11-07 NOTE — Progress Notes (Signed)
[facility] MD Progress Note  11/07/2015  Tracy Gross  MRN:  [id number] Subjective:  Patient states "I was trying to run out earlier today. Sorry about that. I was just confused. I didn't take those pills you all wanted me to take. That Haldol. I guess I can take it if you think it will help me."  Objective:Tracy Gross is a 25 y.o. female, single, Caucasian , employed , lives with her partner , who presented to [facility] accompanied by her parents for worsening anxiety sx.   Pt seen and chart reviewed. Pt is alert/oriented x4, calm, cooperative, and appropriate to situation. Pt denies suicidal/homicidal ideation and psychosis and does not appear to be responding to internal stimuli. However, pt has had intermittent bouts of confusion and was an elopement risk, with nursing staff reporting that she wanted to leave today, and that she was impulsive about it, asking many people.    Principal Problem: MDD (major depressive disorder), single episode, severe with psychotic features (HCC) Diagnosis:   Patient Active Problem List   Diagnosis Date Noted  . MDD (major depressive disorder), single episode, severe with psychotic features (HCC) [F32.3] 11/05/2015    Priority: High  . UTI (urinary tract infection) [N39.0] 11/01/2015   Total Time spent with patient: 15 minutes   Past Psychiatric History: Pt was recently started on Lexapro for anxiety sx, but stopped taking due to side effects. Denies hx of mental illness or being in an inpatient hospital in the past.Denies hx of suicide attempts.  Past Medical History: Pt denies hx of medical issues like Thyroid sx, HTN,DM  Family History: Pt denies hx of HTN, DM, Thyroid issues in family.  Family History  Problem Relation Age of Onset  . Mental illness Neg Hx    Family Psychiatric History: Pt denies hx of mental illness, substance abuse or suicide in family. 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   History  Alcohol Use  . Yes    Comment: occasional      History  Drug Use No    Social History   Social History  . Marital Status: Single    Spouse Name: N/A  . Number of Children: N/A  . Years of Education: N/A   Social History Main Topics  . Smoking status: Never Smoker   . Smokeless tobacco: None  . Alcohol Use: Yes     Comment: occasional   . Drug Use: No  . Sexual Activity: Not Asked   Other Topics Concern  . None   Social History Narrative   Additional Social History:    Pain Medications: n/a Name of Substance 1: n/a                  Sleep: Fair  Appetite:  Good  Current Medications: Current Facility-Administered Medications  Medication Dose Route Frequency Provider Last Rate Last Dose  . acetaminophen (TYLENOL) tablet 650 mg  650 mg Oral Q6H PRN Jomarie Longs, MD   650 mg at 11/01/15 1605  . alum & mag hydroxide-simeth (MAALOX/MYLANTA) 200-200-20 MG/5ML suspension 30 mL  30 mL Oral Q4H PRN Jomarie Longs, MD      . benztropine (COGENTIN) tablet 0.5 mg  0.5 mg Oral BID Jomarie Longs, MD   0.5 mg at 11/07/15 1644  . haloperidol (HALDOL) tablet 5 mg  5  mg Oral Q6H PRN Jomarie Longs, MD   5 mg at 11/07/15 1136   And  . benztropine (COGENTIN) tablet 1 mg  1 mg Oral Q6H PRN Jomarie Longs, MD   1 mg at 11/07/15 1140  . cephALEXin (KEFLEX) capsule 500 mg  500 mg Oral 4 times per day Jomarie Longs, MD   500 mg at 11/07/15 1708  . citalopram (CELEXA) tablet 20 mg  20 mg Oral Daily Saramma Eappen, MD   20 mg at 11/07/15 5621  . haloperidol (HALDOL) tablet 3 mg  3 mg Oral BID Jomarie Longs, MD   3 mg at 11/07/15 1644  . hydrOXYzine (ATARAX/VISTARIL) tablet 25 mg  25 mg Oral Q6H PRN Jomarie Longs, MD   25 mg at 11/05/15 2157  . magnesium hydroxide (MILK OF MAGNESIA) suspension 30 mL  30 mL Oral Daily PRN Jomarie Longs, MD      . traZODone  (DESYREL) tablet 100 mg  100 mg Oral QHS PRN Worthy Flank, NP   100 mg at 11/05/15 2156    Lab Results:  No results found for this or any previous visit (from the past 48 hour(s)).  Physical Findings: AIMS: Facial and Oral Movements Muscles of Facial Expression: None, normal Lips and Perioral Area: None, normal Jaw: None, normal Tongue: None, normal,Extremity Movements Upper (arms, wrists, hands, fingers): None, normal Lower (legs, knees, ankles, toes): None, normal, Trunk Movements Neck, shoulders, hips: None, normal, Overall Severity Severity of abnormal movements (highest score from questions above): None, normal Incapacitation due to abnormal movements: None, normal Patient's awareness of abnormal movements (rate only patient's report): No Awareness, Dental Status Current problems with teeth and/or dentures?: No Does patient usually wear dentures?: No  CIWA:  CIWA-Ar Total: 7 COWS:  COWS Total Score: 2  Musculoskeletal: Strength & Muscle Tone: within normal limits Gait & Station: normal Patient leans: N/A  Psychiatric Specialty Exam: Review of Systems  Psychiatric/Behavioral: Positive for depression. Negative for suicidal ideas and hallucinations. The patient is nervous/anxious.   All other systems reviewed and are negative.   Blood pressure 120/77, pulse 133, temperature 99 F (37.2 C), temperature source Oral, resp. rate 20, height  (1.626 m), weight 86.183 kg (190 lb), last menstrual period 11/01/2015, SpO2 100 %.Body mass index is 32.6 kg/(m^2).  General Appearance: Fairly Groomed  Patent attorney::  Good  Speech:  Clear and Coherent  Volume:  Decreased  Mood:  Anxious and Depressed yet improving slightly  Affect:  Congruent  Thought Process:  Goal Directed, irrelevant at times  Orientation:  Full (Time, Place, and Person)  Thought Content:  Paranoid Ideation and Rumination on and off  Suicidal Thoughts:  No   Homicidal Thoughts:  No  Memory:  Immediate;    Fair Recent;   Fair Remote;   Fair  Judgement:  Impaired  Insight:  Lacking  Psychomotor Activity:  Restlessness  Concentration:  Poor  Recall:  Poor  Fund of Knowledge:Poor  Language: Good  Akathisia:  No  Handed:  Right  AIMS (if indicated):     Assets:  Communication Skills Desire for Improvement Resilience Social Support  ADL's:  Intact  Cognition: WNL  Sleep:  Number of Hours: 6    Treatment Plan Summary:Patient with no past hx of mental illness, reported recent onset of anxiety , reports several psychosocial stressors building up over the past year , several deaths in family, working two jobs, relational issues with partner.  Patient also with UTI -  on Keflex- unknown  if this is also causing some of her confusion/psychosis . Patient today with anxiety as well as depressive sx .Will continue treatment.  Daily contact with patient to assess and evaluate symptoms and progress in treatment and Medication management   Today, on 11/07/2015, pt is improving slightly although nursing staff report that she was very impulsive earlier in the day.  Will continue Haldol to 3 mg po bid for psychosis. Will continue Cogentin 0.5 mg po bid for EPS. Will continue Celexa to 20  mg po daily for affective sx. Will continue Trazodone 100 mg po qhs for sleep. Continue Vistaril 25 mg po q6h prn for severe anxiety sx.  Continue Keflex 500 mg po qid for UTI. Continue Haldol 5 mg po prn for agitation/psychosis. Will continue to monitor vitals ,medication compliance and treatment side effects while patient is here.  Will monitor for medical issues as well as call consult as needed.  Reviewed labs ,UA - UTI , TSH - wnl, UDS- negative , CBC-wnl, bmp -wnl, CT scan - wnl.  Recreational therapy consult. CSW will start working on disposition. Patient to participate in therapeutic milieu .  Beau Fanny, FNP-BC 11/07/2015, 3:12PM Agree with NP Progress Note, as above

## 2015-11-07 NOTE — Progress Notes (Signed)
Pt family on unit to visit.

## 2015-11-07 NOTE — Progress Notes (Signed)
D:Per patient self inventory form pt reports she slept good last night. Pt reports a good appetite, hyper energy level, good concentration. Pt rates depression 0/10, hopelessness 0/10, anxiety 0/10- all on 0-10 scale, 10 being the worse. Pt denies SI/HI. Denies physical pain. Pt reports her goal is "relaxing right now" Pt reports she will "make phone calls to know I am fine" to help meet her goal today.  "When is your doctor coming in? I have a list." Pt presents with impulsive behavior. Continues to walk into other patients room. Pt disconnected. No insight in regards to her behavior, pacing the hallway, doing exercises in room. Making multiple phone calls on hall phone. Slow to respond on approach. Not attending nursing group on the unit.Pt reports increase in anxiety and agitation.   A:Special checks q 15 mins in place for safety. Medication administered per MD order(See eMAR) Encouragement and support provided.  R:safety mantained. Compliant with medication regimen. Will continue to monitor.

## 2015-11-07 NOTE — [facility] Group Notes (Signed)
[facility] Group Notes:  (Clinical Social Work)  11/07/2015  9:00-9:45AM  Summary of Progress/Problems:   Today's process group involved patients discussing their feelings related to being hospitalized, as well as how they can use their present feelings to create a goal for the day. The patient expressed her primary feeling about being hospitalized is resentment, because she feels like she is a joke.  CSW asked for clarification, and she stated she is a joke to herself.  She was overwhelmed prior to coming to hospital, can see it was good to get the help.  She was pleasant and smiling throughout group.  Type of Therapy:  Group Therapy - Process  Participation Level:  Active  Participation Quality:  Attentive and Sharing  Affect:  Appropriate  Cognitive:  Confused  Insight:  Developing/Improving  Engagement in Therapy:  Engaged  Modes of Intervention:  Exploration, Discussion  Ambrose Mantle, LCSW 11/07/2015, 1:05 PM

## 2015-11-07 NOTE — Progress Notes (Signed)
Tracy Gross is requesting a full gynecological exam as well as HPV testing. She states she just feels something is not right "down there". She also is inquiring into "trauma testing". States she would like to be "checked out" for any trauma that may have occurred to her "down there" in the past when she was younger. I encouraged her to schedule a routine GYN exam as soon as she is discharged.

## 2015-11-07 NOTE — Progress Notes (Signed)
Pt put in 72 hour request for discharge 11/07/2015 @ 1145

## 2015-11-08 NOTE — Progress Notes (Signed)
[facility] Group Notes:  (Nursing/MHT/Case Management/Adjunct)  Date:  11/08/2015  Time:  9:53 PM  Type of Therapy:  Psychoeducational Skills  Participation Level:  Active  Participation Quality:  Appropriate  Affect:  Appropriate  Cognitive:  Appropriate  Insight:  Good  Engagement in Group:  Engaged  Modes of Intervention:  Education  Summary of Progress/Problems: The patient expressed in group that she had a very good day since she "exceeded her expectations". She explained that she was able to remain in group for the entire duration versus walking out of the room. The patient's support system will consist of her family and therapist. According to the patient, she has a therapist at the present time in [place].   Hazle Coca S 11/08/2015, 9:53 PM

## 2015-11-08 NOTE — Progress Notes (Signed)
[facility] MD Progress Note  11/08/2015  Tracy Gross  MRN:  [id number] Subjective:  Patient states, "I did not mean to run out of here.  Am I still going to be discharged?" Objective:Tracy Gross is a 25 y.o. female, single, Caucasian , employed , lives with her partner , who presented to [facility] accompanied by her parents for worsening anxiety sx.   Pt seen and chart reviewed. Pt is alert/oriented x4, calm, cooperative, and appropriate to situation. Pt denies suicidal/homicidal ideation and psychosis and does not appear to be responding to internal stimuli. However, pt has had intermittent bouts of confusion and was an elopement risk, with nursing staff reporting that she wanted to leave today, and that she was impulsive about it, asking many people.    Principal Problem: MDD (major depressive disorder), single episode, severe with psychotic features (HCC) Diagnosis:   Patient Active Problem List   Diagnosis Date Noted  . MDD (major depressive disorder), single episode, severe with psychotic features (HCC) [F32.3] 11/05/2015  . UTI (urinary tract infection) [N39.0] 11/01/2015   Total Time spent with patient: 15 minutes   Past Psychiatric History: Pt was recently started on Lexapro for anxiety sx, but stopped taking due to side effects. Denies hx of mental illness or being in an inpatient hospital in the past.Denies hx of suicide attempts.  Past Medical History: Pt denies hx of medical issues like Thyroid sx, HTN,DM  Family History: Pt denies hx of HTN, DM, Thyroid issues in family.  Family History  Problem Relation Age of Onset  . Mental illness Neg Hx    Family Psychiatric History: Pt denies hx of mental illness, substance abuse or suicide in family. 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   History  Alcohol Use  . Yes     Comment: occasional      History  Drug Use No    Social History   Social History  . Marital Status: Single    Spouse Name: N/A  . Number of Children: N/A  . Years of Education: N/A   Social History Main Topics  . Smoking status: Never Smoker   . Smokeless tobacco: None  . Alcohol Use: Yes     Comment: occasional   . Drug Use: No  . Sexual Activity: Not Asked   Other Topics Concern  . None   Social History Narrative   Additional Social History:    Pain Medications: n/a Name of Substance 1: n/a  Sleep: Fair  Appetite:  Good  Current Medications: Current Facility-Administered Medications  Medication Dose Route Frequency Provider Last Rate Last Dose  . acetaminophen (TYLENOL) tablet 650 mg  650 mg Oral Q6H PRN Jomarie Longs, MD   650 mg at 11/01/15 1605  . alum & mag hydroxide-simeth (MAALOX/MYLANTA) 200-200-20 MG/5ML suspension 30 mL  30 mL Oral Q4H PRN Jomarie Longs, MD      . benztropine (COGENTIN) tablet 0.5 mg  0.5 mg Oral BID Jomarie Longs, MD   0.5 mg at 11/08/15 0748  . haloperidol (HALDOL) tablet 5 mg  5 mg Oral Q6H PRN Jomarie Longs, MD   5 mg at 11/07/15 1136   And  . benztropine (COGENTIN) tablet 1 mg  1 mg Oral Q6H PRN Jomarie Longs, MD   1 mg at 11/07/15 1140  . cephALEXin (KEFLEX) capsule 500  mg  500 mg Oral 4 times per day Jomarie Longs, MD   500 mg at 11/08/15 1140  . citalopram (CELEXA) tablet 20 mg  20 mg Oral Daily Saramma Eappen, MD   20 mg at 11/08/15 0747  . haloperidol (HALDOL) tablet 3 mg  3 mg Oral BID Jomarie Longs, MD   3 mg at 11/08/15 0747  . hydrOXYzine (ATARAX/VISTARIL) tablet 25 mg  25 mg Oral Q6H PRN Jomarie Longs, MD   25 mg at 11/05/15 2157  . magnesium hydroxide (MILK OF MAGNESIA) suspension 30 mL  30 mL Oral Daily PRN Jomarie Longs, MD      . traZODone (DESYREL) tablet 100 mg  100 mg Oral QHS PRN Worthy Flank, NP   100 mg at 11/07/15 2235    Lab Results:  No results found for this or any previous visit (from the  past 48 hour(s)).  Physical Findings: AIMS: Facial and Oral Movements Muscles of Facial Expression: None, normal Lips and Perioral Area: None, normal Jaw: None, normal Tongue: None, normal,Extremity Movements Upper (arms, wrists, hands, fingers): None, normal Lower (legs, knees, ankles, toes): None, normal, Trunk Movements Neck, shoulders, hips: None, normal, Overall Severity Severity of abnormal movements (highest score from questions above): None, normal Incapacitation due to abnormal movements: None, normal Patient's awareness of abnormal movements (rate only patient's report): No Awareness, Dental Status Current problems with teeth and/or dentures?: No Does patient usually wear dentures?: No  CIWA:  CIWA-Ar Total: 7 COWS:  COWS Total Score: 2  Musculoskeletal: Strength & Muscle Tone: within normal limits Gait & Station: normal Patient leans: N/A  Psychiatric Specialty Exam: Review of Systems  Psychiatric/Behavioral: Positive for depression. The patient is nervous/anxious.   All other systems reviewed and are negative.   Blood pressure 113/80, pulse 100, temperature 98.7 F (37.1 C), temperature source Oral, resp. rate 20, height  (1.626 m), weight 86.183 kg (190 lb), last menstrual period 11/01/2015, SpO2 100 %.Body mass index is 32.6 kg/(m^2).  General Appearance: Fairly Groomed  Patent attorney::  Good  Speech:  Clear and Coherent  Volume:  Decreased  Mood:  Anxious and Depressed yet improving slightly  Affect:  Congruent  Thought Process:  Goal Directed, irrelevant at times  Orientation:  Full (Time, Place, and Person)  Thought Content:  Paranoid Ideation and Rumination on and off  Suicidal Thoughts:  No   Homicidal Thoughts:  No  Memory:  Immediate;   Fair Recent;   Fair Remote;   Fair  Judgement:  Impaired  Insight:  Lacking  Psychomotor Activity:  Restlessness  Concentration:  Poor  Recall:  Poor  Fund of Knowledge:Poor  Language: Good  Akathisia:  No   Handed:  Right  AIMS (if indicated):     Assets:  Communication Skills Desire for Improvement Resilience Social Support  ADL's:  Intact  Cognition: WNL  Sleep:  Number of Hours: 6    Treatment Plan Summary:  Patient with no past hx of mental illness, reported recent onset of anxiety, reports several psychosocial stressors building up over the past year , several deaths in family, working two jobs, relational issues with partner.  Patient also with UTI -  on Keflex- unknown if this is also causing some of her confusion/psychosis . Patient today with anxiety as well as depressive sx.  Will continue treatment.  Daily contact with patient to assess and evaluate symptoms and progress in treatment and Medication management   Today, on 11/08/2015, pt is improving slightly although nursing  staff report that she was very impulsive earlier in the day.  Will continue Haldol to 3 mg po bid for psychosis. Will continue Cogentin 0.5 mg po bid for EPS. Will continue Celexa to 20  mg po daily for affective sx. Will continue Trazodone 100 mg po qhs for sleep. Continue Vistaril 25 mg po q6h prn for severe anxiety sx.  Continue Keflex 500 mg po qid for UTI. Continue Haldol 5 mg po prn for agitation/psychosis. Will continue to monitor vitals ,medication compliance and treatment side effects while patient is here.  Will monitor for medical issues as well as call consult as needed.  Reviewed labs ,UA - UTI , TSH - wnl, UDS- negative , CBC-wnl, bmp -wnl, CT scan - wnl.  Recreational therapy consult. CSW will start working on disposition. Patient to participate in therapeutic milieu .  Velna Hatchet May Agustin, AGNP-BC 11/08/2015, 3:12PM Agree with NP Progress Note, as above

## 2015-11-08 NOTE — Progress Notes (Signed)
Writer spoke with patient 1:1 and she reports that her day has been ok but did not elaborate on it. She reports that she still hears messages through the tv and in her head. She has been up in the dayroom briefly this evening. She reports that her parner is her support system. She denies si/hi. Patient appears as if she is responidng to internal stimuli during our brief conversation. Support given and safety maintained on unit with 15 min checks.

## 2015-11-08 NOTE — Progress Notes (Signed)
Writer spoke with patient after her parents and younger brother visited tonight. She reports that she feels better but still sees messages and hears voices but not as bad as when she first came. She looks better tonight with not such an intense stare as the previous night. She denies si/hi/ She did report being glad that she has a room mate. She attended group tonight and is informed of scheduled medication. Safety maintained on unit with 15 min checks.

## 2015-11-08 NOTE — Progress Notes (Signed)
D:Per patient self inventory form pt reprots she slept good last night with the use of sleep medication. Pt reports a good appetite, normal energy level, good concentration. Pt rates depression 0/10, hopelessness 0/10, anxiety 0/10- all on 0-10 scale, 10 being the worse. Pt denies SI/HI. Reports auditory hallucinations. Pt reports her goal is "ignosing others" and that she will "think positive" to help meet her goal today. Pt slow to respond on approach, intense stare noted at times, but pleasant. Student nurse with pt.   A:special checks q 15 mins in place for safety. Medication administered per MD order "(See eMAR) Encouragement and support provided. Pt noted interacting with student nurse.   R:Safety maintained. Compliant with medication regimen. Will continue to monitor.

## 2015-11-08 NOTE — [facility] Group Notes (Signed)
[facility] Group Notes:  (Clinical Social Work)  11/08/2015  9:15AM-10:20AM  Summary of Progress/Problems:  The main focus of today's process group was to listen to a variety of genres of music and to identify that different types of music provoke different responses.  The patient then was able to identify personally what was soothing, energizing, depressing, inspiring.   The patient expressed understanding of concepts, as well as knowledge of how each type of music affected her and how this can be used at home as a wellness/recovery tool.  She was late to group, but became active with the music, dancing and singing, as soon as she did come to group.  She smiled a lot, smoke little.  Type of Therapy:  Music Therapy   Participation Level:  Active  Participation Quality:  Attentive   Affect:  Appropriate  Cognitive:  Perhaps confused  Insight:  Improving  Engagement in Therapy:  Engaged  Modes of Intervention:   Activity, Exploration  Ambrose Mantle, LCSW 11/08/2015 12:58 PM

## 2015-11-08 NOTE — Plan of Care (Signed)
Problem: Alteration in thought process Goal: STG-Patient is able to discuss thoughts with staff Outcome: Progressing Pt is able to discuss her thoughts with staff this shift.

## 2015-11-08 NOTE — [facility] Group Notes (Signed)
[facility] Group Notes:  (Nursing/MHT/Case Management/Adjunct)  Date:  11/08/2015  Time:  1300  Type of Therapy:  Nurse Education  Participation Level:  Active  Participation Quality:  Appropriate and Attentive  Affect:  Flat  Cognitive:  Alert and Appropriate  Insight:  Good  Engagement in Group:  Engaged  Modes of Intervention:  Activity, Clarification, Socialization and Support  Summary of Progress/Problems:  Dara Hoyer 11/08/2015, 3:30 PM

## 2015-11-09 DIAGNOSIS — N390 Urinary tract infection, site not specified: Secondary | ICD-10-CM

## 2015-11-09 NOTE — Progress Notes (Signed)
DAR Note: Tracy Gross has been visible on the unit.  She denies SI/HI or A/V hallucinations.  She states that she is ready to go.  She denies any pain or discomfort and appears to be in no physical distress.  She completed her self inventory and reports that her depression, hopelessness and anxiety are 0/10.  She states that her goal for today is "discharge planning with the doctor" and she will accomplish this goal by "meeting with the doctor."  T.C. From fiance, Tracy Gross, stating she doesn't feel like Tracy Gross is much better and is concerned about her being discharged.  Referred her to Rod, SW, to discuss d/c information.  Encouraged continued participation in group and unit activities.  Q 15 minute checks maintained for safety.  We will continue to monitor the progress towards her goals.

## 2015-11-09 NOTE — Plan of Care (Signed)
Problem: Alteration in mood Goal: LTG-Patient reports reduction in suicidal thoughts (Patient reports reduction in suicidal thoughts and is able to verbalize a safety plan for whenever patient is feeling suicidal)  Outcome: Progressing Patient currently denies suicidal ideations.      

## 2015-11-09 NOTE — [facility] Group Notes (Signed)
[facility] Group Notes:  (Counselor/Nursing/MHT/Case Management/Adjunct)  11/09/2015 1:15PM  Type of Therapy:  Group Therapy  Participation Level:  Active  Participation Quality:  Appropriate  Affect:  Flat  Cognitive:  Oriented  Insight:  Improving  Engagement in Group:  Limited  Engagement in Therapy:  Limited  Modes of Intervention:  Discussion, Exploration and Socialization  Summary of Progress/Problems: The topic for group was balance in life.  Pt participated in the discussion about when their life was in balance and out of balance and how this feels.  Pt discussed ways to get back in balance and short term goals they can work on to get where they want to be. Stayed the entire time.  Engaged throughout. Stated she feels "close to balanced" today, and knows this because everyday "I have been feeling a little more clear,a nd less confused."  Says the way she gets balanced is to take a daily inventory and figure out what she can do to help herself that day.  "It might be yoga, it might be exercise, it might be asking someone else for help, it just depends on the day and the need."   Daryel Gerald B 11/09/2015 5:07 PM

## 2015-11-09 NOTE — Plan of Care (Signed)
Problem: Alteration in thought process Goal: LTG-Patient behavior demonstrates decreased signs psychosis (Patient behavior demonstrates decreased signs of psychosis to the point the patient is safe to return home and continue treatment in an outpatient setting.)  Outcome: Not Progressing Patient behavior does demonstrate decreased signs of psychosis. She continues to go back and forth between clarity and then later on confusion.

## 2015-11-09 NOTE — Progress Notes (Signed)
[facility] MD Progress Note  11/09/2015  Tracy Gross  MRN:  [id number] Subjective:  Patient states, "I feel better today , I am not that confused or anything. I had a good day yesterday.'   Objective:Tracy Gross is a 25 y.o. female, single, Caucasian , employed , lives with her partner , who presented to [facility] accompanied by her parents for worsening anxiety sx.  Pt seen and chart reviewed. Pt is alert/oriented x4, calm, cooperative, and appropriate to situation. Pt today seen as less anxious , less depressed, is not seen as responding to internal stimuli. Pt per staff continues to be bizarre , needs redirection on the unit on and off , but overall progressing on current medications. Denies ADRs of medications.   Principal Problem: MDD (major depressive disorder), single episode, severe with psychotic features (HCC) Diagnosis:   Patient Active Problem List   Diagnosis Date Noted  . MDD (major depressive disorder), single episode, severe with psychotic features (HCC) [F32.3] 11/05/2015  . UTI (urinary tract infection) [N39.0] 11/01/2015   Total Time spent with patient: 25 minutes   Past Psychiatric History: Pt was recently started on Lexapro for anxiety sx, but stopped taking due to side effects. Denies hx of mental illness or being in an inpatient hospital in the past.Denies hx of suicide attempts.  Past Medical History: Pt denies hx of medical issues like Thyroid sx, HTN,DM  Family History: Pt denies hx of HTN, DM, Thyroid issues in family.  Family History  Problem Relation Age of Onset  . Mental illness Neg Hx    Family Psychiatric History: Pt denies hx of mental illness, substance abuse or suicide in family. 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   History  Alcohol Use  . Yes    Comment: occasional      History   Drug Use No    Social History   Social History  . Marital Status: Single    Spouse Name: N/A  . Number of Children: N/A  . Years of Education: N/A   Social History Main Topics  . Smoking status: Never Smoker   . Smokeless tobacco: None  . Alcohol Use: Yes     Comment: occasional   . Drug Use: No  . Sexual Activity: Not Asked   Other Topics Concern  . None   Social History Narrative   Additional Social History:    Pain Medications: n/a Name of Substance 1: n/a  Sleep: Fair  Appetite:  Good  Current Medications: Current Facility-Administered Medications  Medication Dose Route Frequency Provider Last Rate Last Dose  . acetaminophen (TYLENOL) tablet 650 mg  650 mg Oral Q6H PRN Jomarie Longs, MD   650 mg at 11/01/15 1605  . alum & mag hydroxide-simeth (MAALOX/MYLANTA) 200-200-20 MG/5ML suspension 30 mL  30 mL Oral Q4H PRN Jomarie Longs, MD      . benztropine (COGENTIN) tablet 0.5 mg  0.5 mg Oral BID Jomarie Longs, MD   0.5 mg at 11/09/15 0823  . haloperidol (HALDOL) tablet 5 mg  5 mg Oral Q6H PRN Jomarie Longs, MD   5 mg at 11/07/15 1136   And  . benztropine (COGENTIN) tablet 1 mg  1 mg Oral Q6H PRN Jomarie Longs, MD   1 mg at 11/07/15 1140  . cephALEXin (KEFLEX) capsule 500 mg  500 mg  Oral 4 times per day Jomarie Longs, MD   500 mg at 11/09/15 1207  . citalopram (CELEXA) tablet 20 mg  20 mg Oral Daily Tacori Kvamme, MD   20 mg at 11/09/15 0823  . haloperidol (HALDOL) tablet 3 mg  3 mg Oral BID Jomarie Longs, MD   3 mg at 11/09/15 0823  . hydrOXYzine (ATARAX/VISTARIL) tablet 25 mg  25 mg Oral Q6H PRN Jomarie Longs, MD   25 mg at 11/05/15 2157  . magnesium hydroxide (MILK OF MAGNESIA) suspension 30 mL  30 mL Oral Daily PRN Jomarie Longs, MD      . traZODone (DESYREL) tablet 100 mg  100 mg Oral QHS PRN Worthy Flank, NP   100 mg at 11/08/15 2208    Lab Results:  No results found for this or any previous visit (from the past 48 hour(s)).  Physical  Findings: AIMS: Facial and Oral Movements Muscles of Facial Expression: None, normal Lips and Perioral Area: None, normal Jaw: None, normal Tongue: None, normal,Extremity Movements Upper (arms, wrists, hands, fingers): None, normal Lower (legs, knees, ankles, toes): None, normal, Trunk Movements Neck, shoulders, hips: None, normal, Overall Severity Severity of abnormal movements (highest score from questions above): None, normal Incapacitation due to abnormal movements: None, normal Patient's awareness of abnormal movements (rate only patient's report): No Awareness, Dental Status Current problems with teeth and/or dentures?: No Does patient usually wear dentures?: No  CIWA:  CIWA-Ar Total: 7 COWS:  COWS Total Score: 2  Musculoskeletal: Strength & Muscle Tone: within normal limits Gait & Station: normal Patient leans: N/A  Psychiatric Specialty Exam: Review of Systems  Psychiatric/Behavioral: Positive for depression. The patient is nervous/anxious.   All other systems reviewed and are negative.   Blood pressure 112/73, pulse 109, temperature 98.7 F (37.1 C), temperature source Oral, resp. rate 18, height  (1.626 m), weight 86.183 kg (190 lb), last menstrual period 11/01/2015, SpO2 100 %.Body mass index is 32.6 kg/(m^2).  General Appearance: Fairly Groomed  Patent attorney::  Good  Speech:  Clear and Coherent  Volume:  Decreased  Mood:  Anxious and Depressed yet improving slightly  Affect:  Congruent  Thought Process:  Goal Directed, irrelevant at times, but improving  Orientation:  Full (Time, Place, and Person)  Thought Content:  Paranoid Ideation and Rumination on and off  Suicidal Thoughts:  No   Homicidal Thoughts:  No  Memory:  Immediate;   Fair Recent;   Fair Remote;   Fair  Judgement:  Fair  Insight:  Shallow  Psychomotor Activity:  Restlessness  Concentration:  Fair  Recall:  Poor  Fund of Knowledge:Fair  Language: Good  Akathisia:  No  Handed:  Right   AIMS (if indicated):     Assets:  Communication Skills Desire for Improvement Resilience Social Support  ADL's:  Intact  Cognition: WNL  Sleep:  Number of Hours: 6.25    Treatment Plan Summary:  Patient with no past hx of mental illness, reported recent onset of anxiety, reports several psychosocial stressors building up over the past year , several deaths in family, working two jobs, relational issues with partner.  Patient also with UTI -  on Keflex- unknown if this is also causing some of her confusion/psychosis . Patient today with improvement .  Will continue treatment.Pt has signed a 72 hr discharge application on 11/07/15.  Daily contact with patient to assess and evaluate symptoms and progress in treatment and Medication management    Will continue Haldol  3  mg po bid for psychosis. Will continue Cogentin 0.5 mg po bid for EPS. Will continue Celexa  20  mg po daily for affective sx. Will continue Trazodone 100 mg po qhs for sleep. Continue Vistaril 25 mg po q6h prn for severe anxiety sx.  Continue Keflex 500 mg po qid for UTI. Continue Haldol 5 mg po prn for agitation/psychosis. Will continue to monitor vitals ,medication compliance and treatment side effects while patient is here.  Will monitor for medical issues as well as call consult as needed.  Reviewed labs ,UA - UTI , TSH - wnl, UDS- negative , CBC-wnl, bmp -wnl, CT scan - wnl.  Recreational therapy consult. CSW will start working on disposition. Patient to participate in therapeutic milieu .  Mykayla Brinton, MD 11/09/2015, 1;12 PM

## 2015-11-09 NOTE — Progress Notes (Signed)
Adult Psychoeducational Group Note  Date:  11/09/2015 Time:  8:58 PM  Group Topic/Focus:  Wrap-Up Group:   The focus of this group is to help patients review their daily goal of treatment and discuss progress on daily workbooks.  Participation Level:  Active  Participation Quality:  Appropriate and Attentive  Affect:  Appropriate  Cognitive:  Appropriate  Insight: Appropriate and Good  Engagement in Group:  Engaged  Modes of Intervention:  Discussion  Additional Comments:  Pt rated her day a "8" out of 10. Pt mentioned she felt sleepy today and even though she participated and attended all the groups, she felt like she didn't give her best insight on things.   Merlinda Frederick 11/09/2015, 8:58 PM

## 2015-11-10 DIAGNOSIS — N390 Urinary tract infection, site not specified: Secondary | ICD-10-CM | POA: Insufficient documentation

## 2015-11-10 MED ORDER — HALOPERIDOL 1 MG PO TABS: 3.0000 mg | ORAL_TABLET | Freq: Two times a day (BID) | ORAL | Status: AC

## 2015-11-10 MED ORDER — HYDROXYZINE HCL 25 MG PO TABS: 25.0000 mg | ORAL_TABLET | Freq: Four times a day (QID) | ORAL | Status: AC | PRN

## 2015-11-10 MED ORDER — CITALOPRAM HYDROBROMIDE 20 MG PO TABS: 20.0000 mg | ORAL_TABLET | Freq: Every day | ORAL | Status: AC

## 2015-11-10 MED ORDER — TRAZODONE HCL 100 MG PO TABS: 100.0000 mg | ORAL_TABLET | Freq: Every evening | ORAL | Status: AC | PRN

## 2015-11-10 MED ORDER — BENZTROPINE MESYLATE 0.5 MG PO TABS: 0.5000 mg | ORAL_TABLET | Freq: Two times a day (BID) | ORAL | Status: AC

## 2015-11-10 NOTE — Progress Notes (Signed)
DAR Note: Tracy Gross has been visible on the unit.  Interacting with peers.  Attending groups.  She denies SI/HI or A/V hallucinations.  She takes medications without difficulty.  She denies any pain or discomfort.  She completed her self inventory and reports that her depression, anxiety and hopelessness are 0/10.  Her goal for today is "implementing discharge" and she will accomplish this goal by "talking to doctor about pre-reading discharge plan."  Pt. D/C from the unit accompanied by mother.  D/C follow up paperwork reviewed with pt.  Copies of d/c paperwork, prescriptions, samples and follow up instructions sent.  Belongings (from locker # 26 - hoodie, pants, shoes, teddy bear, pillow, blankets) returned to pt. Q 15 min checks maintained until discharge.  She left the unit in no apparent distress.

## 2015-11-10 NOTE — Tx Team (Signed)
Interdisciplinary Treatment Plan Update (Adult)  Date:  11/10/2015   Time Reviewed:  1:55 PM   Progress in Treatment: Attending groups: Intermittently Participating in groups:  No Taking medication as prescribed:  Yes. Tolerating medication:  Yes. Family/Significant other contact made:  No Patient understands diagnosis:  Yes  As evidenced by seeking help with "stress" Discussing patient identified problems/goals with staff:  Yes, see initial care plan. Medical problems stabilized or resolved:  Yes. Denies suicidal/homicidal ideation: Yes. Issues/concerns per patient self-inventory:  No. Other:  New problem(s) identified:  Discharge Plan or Barriers:  Reason for Continuation of Hospitalization:   Comments:  Tracy Gross is an 25 y.o. female, single, Caucasian who presents to [facility] accompanied by her parents, who participated in assessment with Pt's consent. Pt reports she has felt confused over the past ten days. She states that she is "seeing messages in everything" and that "things don't follow." Pt acknowledges that she feels out of touch with reality. Pt's mother states that Pt works as an Glass blower/designer for a psychologist and her employer informed Pt's partner that Pt started behaving oddly ten days ago. Parents report Pt has appeared confused, stating she doesn't have a twin sister (which she does). Pt has been trying to go into stranger's cars, walking out of the house with no idea where she is going and other strange behaviors. Pt described her mood as "all over the place." Pt reports symptoms including crying spells, social withdrawal, loss of interest in usual pleasures, irritability, decreased concentration, erratic appetite and feelings of hopelessness. Pt reports she has not slept in days. Pt denies current suicidal ideation or history of suicidal gestures. She denies intentional self-injurious behaviors. She denies homicidal ideation or history of violence. She  says she rarely drinks alcohol and infrequently smokes marijuana. She reports last smoking marijuana two weeks ago and has no reason to believe the marijuana might have been laced with another substance. She denies other substance use.  Pt identifies work and relationships as stressors. When asked about problems in her relationship she replies "overcaring." Pt lives with a female partner in [place], [place] and parents [place] to [place] to assist her. Pt denies any history of abuse or trauma. Pt's mother says Pt may be grieving the death of her grandmother and great grandmother, both of whom died in 12/18/14. Pt's denies any family history of mental health problems or substance abuse. When asked who in her life is supportive, Pt replies "nobody" but Pt's parents appear concerned and supportive. Pt has no history of inpatient or outpatient mental health or substance abuse treatment. She denies any history of being prescribed psychiatric medication. Patient with no past hx of mental illness, reported recent onset of anxiety , reports several psychosocial stressors building up over the past year , several deaths in family, working two jobs, relational issues with partner. Pt currently denies any psychosis.  Patient also with UTI - on Keflex- unknown if this is also causing some of her confusion/psychosis . Patient today is agreeable to starting low dose Haldol with Celexa. Continue 1;1 precaution for safety. Will start a trial of Haldol 1 mg po tid for psychosis. Will add Cogentin 0.5 mg po bid for EPS. Will add Celexa 5 mg po daily for affective sx. Will continue Trazodone 100 mg po qhs for sleep. Continue Vistaril 25 mg po q6h prn for severe anxiety sx. Continue Keflex 500 mg po qid for UTI. Continue Haldol 5 mg po  prn for agitation/psychosis.  11/05/15: Continues with confusion; wandering into other patient's rooms and even putting on other's clothes "I feel like I'm dying."  Continue  1;1 precaution for safety reasons. Will increase Haldol to 3 mg po bid for psychosis. Will continue Cogentin 0.5 mg po bid for EPS. Will increase Celexa to 20 mg po daily for affective sx. Will continue Trazodone 100 mg po qhs for sleep. Continue Vistaril 25 mg po q6h prn for severe anxiety sx.  Continue Keflex 500 mg po qid for UTI. Continue Haldol 5 mg po prn for agitation/psychosis.  Estimated length of stay: D/C today  New goal(s):  Review of initial/current patient goals per problem list:   Review of initial/current patient goals per problem list:  1. Goal(s): Patient will participate in aftercare plan   Met: Yes   Target date: 3-5 days post admission date   As evidenced by: Patient will participate within aftercare plan AEB aftercare provider and housing plan at discharge being identified. 11/03/15:  Return home, follow up outpt   2. Goal (s): Patient will exhibit decreased depressive symptoms and suicidal ideations.   Met: Yes   Target date: 3-5 days post admission date   As evidenced by: Patient will utilize self rating of depression at 3 or below and demonstrate decreased signs of depression or be deemed stable for discharge by MD. 11/03/15:  Pt rates her depression a 3 today     3. Goal(s): Patient will demonstrate decreased signs and symptoms of anxiety.   Met: Yes   Target date: 3-5 days post admission date   As evidenced by: Patient will utilize self rating of anxiety at 3 or below and demonstrated decreased signs of anxiety, or be deemed stable for discharge by MD 11/03/15:  Rates anxiety at a 3 today     5. Goal(s): Patient will demonstrate decreased signs of psychosis  * Met:Yes  * Target date: 3-5 days post admission date  * As evidenced by: Patient will demonstrate decreased frequency of AVH or return to baseline function 11/03/15:  Pt reports paranoia, confusion 11/05/15:  Symptoms persist 11/10/15:  Symptoms are greatly diminished.   Family is comfortable with taking pt home.       Attendees: Patient:  11/10/2015 1:55 PM   Family:   11/10/2015 1:55 PM   Physician:  Ursula Alert, MD 11/10/2015 1:55 PM   Nursing:   Manuella Ghazi RN 11/10/2015 1:55 PM   CSW:    Roque Lias, LCSW   11/10/2015 1:55 PM   Other:  11/10/2015 1:55 PM   Other:   11/10/2015 1:55 PM   Other:  Lars Pinks, Nurse CM 11/10/2015 1:55 PM   Other:   11/10/2015 1:55 PM   Other:  Norberto Sorenson, [place]  11/10/2015 1:55 PM   Other:  11/10/2015 1:55 PM   Other:  11/10/2015 1:55 PM   Other:  11/10/2015 1:55 PM   Other:  11/10/2015 1:55 PM   Other:  11/10/2015 1:55 PM   Other:   11/10/2015 1:55 PM    Scribe for Treatment Team:   Trish Mage, 11/10/2015 1:55 PM

## 2015-11-10 NOTE — [facility]HH Suicide Risk Assessment (Signed)
[facility] Discharge Suicide Risk Assessment   Principal Problem: MDD (major depressive disorder), single episode, severe with psychotic features [facility]) Discharge Diagnoses:  Patient Active Problem List   Diagnosis Date Noted  . MDD (major depressive disorder), single episode, severe with psychotic features (HCC) [F32.3] 11/05/2015  . UTI (urinary tract infection) [N39.0] 11/01/2015    Total Time spent with patient: 30 minutes  Musculoskeletal: Strength & Muscle Tone: within normal limits Gait & Station: normal Patient leans: N/A  Psychiatric Specialty Exam: Review of Systems  Psychiatric/Behavioral: Negative for depression, suicidal ideas and hallucinations. The patient is not nervous/anxious.   All other systems reviewed and are negative.   Blood pressure 110/72, pulse 106, temperature 97.9 F (36.6 C), temperature source Oral, resp. rate 16, height  (1.626 m), weight 86.183 kg (190 lb), last menstrual period 11/01/2015, SpO2 100 %.Body mass index is 32.6 kg/(m^2).  General Appearance: Casual  Eye Contact::  Fair  Speech:  Clear and Coherent409  Volume:  Normal  Mood:  Euthymic  Affect:  Appropriate  Thought Process:  Coherent  Orientation:  Full (Time, Place, and Person)  Thought Content:  WDL  Suicidal Thoughts:  No  Homicidal Thoughts:  No  Memory:  Immediate;   Fair Recent;   Fair Remote;   Fair  Judgement:  Fair  Insight:  Shallow  Psychomotor Activity:  Normal  Concentration:  Fair  Recall:  [employer] of Knowledge:Fair  Language: Fair  Akathisia:  No  Handed:  Right  AIMS (if indicated):     Assets:  Desire for Improvement  Sleep:  Number of Hours: 6.75  Cognition: WNL  ADL's:  Intact   Mental Status Per Nursing Assessment::   On Admission:     Demographic Factors:  Caucasian  Loss Factors: NA  Historical Factors: Impulsivity  Risk Reduction Factors:   Positive social support  Continued Clinical Symptoms:  Medical Diagnoses and  Treatments/Surgeries- UTI which could have contributed to her confusion.  Cognitive Features That Contribute To Risk:  None    Suicide Risk:  Minimal: No identifiable suicidal ideation.  Patients presenting with no risk factors but with morbid ruminations; may be classified as minimal risk based on the severity of the depressive symptoms    Plan Of Care/Follow-up recommendations:  Activity:  No restrictions Diet:  regular Tests:  as needed Other:  follow up with after care  Clarnce Homan, MD 11/10/2015, 9:35 AM

## 2015-11-10 NOTE — Progress Notes (Signed)
  D: Pt informed the writer that she's scheduled to be discharged tomorrow.  Stated she plans to "go home for a while". Pt stated she's concerned about her medicine and would not like to get trazodone. Pt has no questions or concerns.   A:  Support and encouragement was offered. 15 min checks continued for safety.  R: Pt remains safe.

## 2015-11-10 NOTE — Discharge Summary (Signed)
Physician Discharge Summary Note  Patient:  Tracy Gross is an 25 y.o., female MRN:  [id number] DOB:  11/05/1990 Patient phone:  [phone] (home)  Patient address:   [address] [place] [address],  Total Time spent with patient: 45 minutes  Date of Admission:  11/01/2015 Date of Discharge: 11/10/2015  Reason for Admission:   Tracy Gross is a 25 y.o. female, single, Caucasian , employed , lives with her partner , who presented to [facility] accompanied by her parents for worsening anxiety sx.   Per initial notes in EHR" Tracy Gross reports she has felt confused over the past ten days. She states that she is "seeing messages in everything" and that "things don't follow." Tracy Gross acknowledges that she feels out of touch with reality. 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 Parents report Tracy Gross has appeared confused, stating she doesn't have a twin sister (which she does). Tracy Gross has been trying to go into strangerGross cars, walking out of the house with no idea where she is going and other strange behaviors. Tracy Gross described her mood as "all over the place." Tracy Gross reports symptoms including crying spells, social withdrawal, loss of interest in usual pleasures, irritability, decreased concentration, erratic appetite and feelings of hopelessness. Tracy Gross reports she has not slept in days. Tracy Gross denies current suicidal ideation or history of suicidal gestures. She denies intentional self-injurious behaviors. She denies homicidal ideation or history of violence. She says she rarely drinks alcohol and infrequently smokes marijuana. She reports last smoking marijuana two weeks ago and has no reason to believe the marijuana might have been laced with another substance. She denies other substance use.  Tracy Gross identifies work and relationships as stressors. When asked about problems in her relationship she replies  "overcaring." Tracy Gross lives with a female partner in [place], [place] and parents brought her to [place] to assist her. Tracy Gross denies any history of abuse or trauma. Tracy GrossGross mother says Tracy Gross may be grieving the death of her grandmother and great grandmother, both of whom died in 02-20-15. Tracy GrossGross denies any family history of mental health problems or substance abuse. When asked who in her life is supportive, Tracy Gross replies "nobody" but Tracy GrossGross parents appear concerned and supportive. Tracy Gross has no history of inpatient or outpatient mental health or substance abuse treatment. She denies any history of being prescribed psychiatric medication."  Patient seen today and chart reviewed.Discussed patient with treatment team. Patient today presents as calm , cooperative , pleasant. Tracy Gross reports that she had been feeling stressed out due to several psychosocial stressors in her life including working two jobs , loss of her grand mother, great grand mother , moving to a new apartment in august and so on. Tracy Gross also reports relational issues with her partner. Tracy Gross reports that all these has been making her more and more anxious and sometimes she feels jittery. Tracy Gross otherwise denied any depressive sx, appetite changes, sleep issues , difficulty concentrating or having SI/HI.  Per initial notes in EHR - Tracy Gross did report some AH - however did not express this to Clinical research associate. Tracy Gross also reportedly was getting messages from what she watches or hears , but she could not elaborate this to Clinical research associate. She instead stated that people usually respond to you the way you do to them. Tracy Gross denied OCD sx, bipolar do sx, hx of sexual or physical abuse. Tracy Gross denied past  hx of being on treatment for mental illness other than the most recent treatment with Lexapro two days ago - which she stopped due to side effects.  Principal Problem: MDD (major depressive disorder), single episode, severe with psychotic features [facility]) Discharge Diagnoses: Patient Active Problem List   Diagnosis  Date Noted  . MDD (major depressive disorder), single episode, severe with psychotic features (HCC) [F32.3] 11/05/2015    Priority: High  . UTI (urinary tract infection) [N39.0] 11/01/2015    Past Psychiatric History: See H&P  Past Medical History: History reviewed. No pertinent past medical history. History reviewed. No pertinent past surgical history. Family History:  Family History  Problem Relation Age of Onset  . Mental illness Neg Hx    Family Psychiatric  History: See H&P Social History:  History  Alcohol Use  . Yes    Comment: occasional      History  Drug Use No    Social History   Social History  . Marital Status: Single    Spouse Name: N/A  . Number of Children: N/A  . Years of Education: N/A   Social History Main Topics  . Smoking status: Never Smoker   . Smokeless tobacco: None  . Alcohol Use: Yes     Comment: occasional   . Drug Use: No  . Sexual Activity: Not Asked   Other Topics Concern  . None   Social History Narrative    Hospital Course:   Tracy Gross was admitted for MDD (major depressive disorder), single episode, severe with psychotic features (HCC), and crisis management.  Tracy Gross was treated discharged with the medications listed below under Medication List.  Medical problems were identified and treated as needed.  Home medications were restarted as appropriate.  Improvement was monitored by observation and Tracy Gross daily report of symptom reduction.  Emotional and mental status was monitored by daily self-inventory reports completed by Tracy Gross and clinical staff.         Tracy Gross was evaluated by the treatment team for stability and plans for continued recovery upon discharge. Tracy Gross motivation was an integral factor for scheduling further treatment. Employment, transportation, bed availability, health status, family support, and any pending legal issues were also considered during hospital stay. Tracy Gross was offered  further treatment options upon discharge including but not limited to Residential, Intensive Outpatient, and Outpatient treatment.  Tracy Gross will follow up with the services as listed below under Follow Up Information.     Upon completion of this admission the patient was both mentally and medically stable for discharge denying suicidal/homicidal ideation, auditory/visual/tactile hallucinations, delusional thoughts and paranoia.    Tracy Gross responded well to treatment with Celexa, Haldol, Cogentin, and Trazodone without adverse effects. Tracy Gross demonstrated improvement without reported or observed adverse effects to the point of stability appropriate for outpatient management. Pertinent labs include: UDS negative. Reviewed CBC, CMP, BAL, and UDS; all unremarkable aside from noted exceptions.   Physical Findings: AIMS: Facial and Oral Movements Muscles of Facial Expression: None, normal Lips and Perioral Area: None, normal Jaw: None, normal Tongue: None, normal,Extremity Movements Upper (arms, wrists, hands, fingers): None, normal Lower (legs, knees, ankles, toes): None, normal, Trunk Movements Neck, shoulders, hips: None, normal, Overall Severity Severity of abnormal movements (highest score from questions above): None, normal Incapacitation due to abnormal movements: None, normal PatientGross awareness of abnormal movements (rate only patientGross report): No Awareness, Dental Status Current problems with teeth and/or dentures?: No Does patient usually wear dentures?: No  CIWA:  CIWA-Ar Total: 7 COWS:  COWS Total Score: 2  Musculoskeletal: Strength & Muscle Tone: within normal limits Gait & Station: normal Patient leans: N/A  Psychiatric Specialty Exam: Review of Systems  Psychiatric/Behavioral: Positive for depression. Negative for hallucinations. The patient is nervous/anxious and has insomnia.   All other systems reviewed and are negative.   Blood pressure 110/72, pulse 106,  temperature 97.9 F (36.6 C), temperature source Oral, resp. rate 16, height  (1.626 m), weight 86.183 kg (190 lb), last menstrual period 11/01/2015, SpO2 100 %.Body mass index is 32.6 kg/(m^2).  SEE MD PSE within the SRA   Have you used any form of tobacco in the last 30 days? (Cigarettes, Smokeless Tobacco, Cigars, and/or Pipes): No  Has this patient used any form of tobacco in the last 30 days? (Cigarettes, Smokeless Tobacco, Cigars, and/or Pipes) Yes, No  Metabolic Disorder Labs:  No results found for: HGBA1C, MPG No results found for: PROLACTIN No results found for: CHOL, TRIG, HDL, CHOLHDL, VLDL, LDLCALC  See Psychiatric Specialty Exam and Suicide Risk Assessment completed by Attending Physician prior to discharge.  Discharge destination:  Home  Is patient on multiple antipsychotic therapies at discharge:  No   Has Patient had three or more failed trials of antipsychotic monotherapy by history:  No  Recommended Plan for Multiple Antipsychotic Therapies: NA     Medication List    STOP taking these medications        acetaminophen 325 MG tablet  Commonly known as:  TYLENOL     escitalopram 10 MG tablet  Commonly known as:  LEXAPRO     ibuprofen 400 MG tablet  Commonly known as:  ADVIL,MOTRIN     LEXAPRO 10 MG tablet  Generic drug:  escitalopram      TAKE these medications      Indication   benztropine 0.5 MG tablet  Commonly known as:  COGENTIN  Take 1 tablet (0.5 mg total) by mouth 2 (two) times daily.   Indication:  Extrapyramidal Reaction caused by Medications     citalopram 20 MG tablet  Commonly known as:  CELEXA  Take 1 tablet (20 mg total) by mouth daily.   Indication:  Depression     haloperidol 1 MG tablet  Commonly known as:  HALDOL  Take 3 tablets (3 mg total) by mouth 2 (two) times daily.   Indication:  Psychosis     hydrOXYzine 25 MG tablet  Commonly known as:  ATARAX/VISTARIL  Take 1 tablet (25 mg total) by mouth every 6 (six) hours  as needed for anxiety.   Indication:  Anxiety Neurosis     traZODone 100 MG tablet  Commonly known as:  DESYREL  Take 1 tablet (100 mg total) by mouth at bedtime as needed for sleep.   Indication:  Trouble Sleeping         Follow-up recommendations:  Activity:  As tolerated Diet:  Heart healthy with low sodium.  Comments:   Take all medications as prescribed. Keep all follow-up appointments as scheduled.  Do not consume alcohol or use illegal drugs while on prescription medications. Report any adverse effects from your medications to your primary care provider promptly.  In the event of recurrent symptoms or worsening symptoms, call 911, a crisis hotline, or go to the nearest emergency department for evaluation.   Signed: Beau Fanny, FNP-BC 11/10/2015, 9:37 AM

## 2015-11-10 NOTE — [facility] Group Notes (Signed)
[facility] Group Notes:  (Nursing/MHT/Case Management/Adjunct)  Date:  11/10/2015  Time:  11:39 AM  Type of Therapy:  Nurse Education  Participation Level:  Active  Participation Quality:  Appropriate  Affect:  Flat  Cognitive:  Alert and Appropriate  Insight:  Limited  Engagement in Group:  Improving  Modes of Intervention:  Discussion and Education  Summary of Progress/Problems:  Group topic was Recovery.  Discussed coping skills, goal setting and importance of sleep.  She was able to state that she likes to sign and listen to her music in her car.    Norm Parcel Kindsey Eblin 11/10/2015, 11:39 AM

## 2015-11-10 NOTE — Progress Notes (Signed)
  [facility] Adult Case Management Discharge Plan :  Will you be returning to the same living situation after discharge:  No. Will stay with parents for awhile At discharge, do you have transportation home?: Yes,  family Do you have the ability to pay for your medications: Yes,  insurance  Release of information consent forms completed and in the chart;  [name].  Patient to Follow up at: Follow-up Information    Follow up with Halina Maidens, MA.   Why:  I left a message for her both Friday and today, and have not heard back from her.  You will need to follow up with her after you have discharged   Contact information:   [address]  Tracy Gross  [phone]      Follow up with [facility] On 11/20/2015.   Why:  Next Friday at 10:00   Call if you need directions.   Contact information:   Dr Lanora Manis [place]-Psychiatrist [address] [place] [phone]      Next level of care provider has access to [facility] Link:no  Safety Planning and Suicide Prevention discussed: Yes,  yes  Have you used any form of tobacco in the last 30 days? (Cigarettes, Smokeless Tobacco, Cigars, and/or Pipes): No  Has patient been referred to the Quitline?: N/A patient is not a smoker  Patient has been referred for addiction treatment: N/A  Tracy Gross 11/10/2015, 1:59 PM

## 2017-01-14 ENCOUNTER — Emergency Department (HOSPITAL_COMMUNITY)
Admission: EM | Admit: 2017-01-14 | Discharge: 2017-01-16 | Disposition: A | Discharge status: Other Acute Inpatient Hospital | Payer: Self-pay | Attending: Emergency Medicine | Admitting: Emergency Medicine

## 2017-01-14 ENCOUNTER — Ambulatory Visit (HOSPITAL_COMMUNITY)
Admission: AD | Admit: 2017-01-14 | Discharge: 2017-01-14 | Disposition: A | Discharge status: Home / Self Care | Payer: Managed Care, Other (non HMO) | Attending: Psychiatry | Admitting: Psychiatry

## 2017-01-14 DIAGNOSIS — R410 Disorientation, unspecified: Secondary | ICD-10-CM | POA: Insufficient documentation

## 2017-01-14 DIAGNOSIS — F323 Major depressive disorder, single episode, severe with psychotic features: Secondary | ICD-10-CM | POA: Insufficient documentation

## 2017-01-14 DIAGNOSIS — Z008 Encounter for other general examination: Secondary | ICD-10-CM

## 2017-01-14 DIAGNOSIS — F329 Major depressive disorder, single episode, unspecified: Secondary | ICD-10-CM | POA: Insufficient documentation

## 2017-01-14 DIAGNOSIS — Z79899 Other long term (current) drug therapy: Secondary | ICD-10-CM | POA: Insufficient documentation

## 2017-01-14 LAB — CBC WITH DIFFERENTIAL/PLATELET
BASOS PCT: 0 %
Basophils Absolute: 0 10*3/uL (ref 0.0–0.1)
Eosinophils Absolute: 0.1 10*3/uL (ref 0.0–0.7)
Eosinophils Relative: 1 %
HEMATOCRIT: 40.2 % (ref 36.0–46.0)
HEMOGLOBIN: 13.4 g/dL (ref 12.0–15.0)
LYMPHS ABS: 2.2 10*3/uL (ref 0.7–4.0)
LYMPHS PCT: 29 %
MCH: 27.1 pg (ref 26.0–34.0)
MCHC: 33.3 g/dL (ref 30.0–36.0)
MCV: 81.2 fL (ref 78.0–100.0)
MONOS PCT: 9 %
Monocytes Absolute: 0.7 10*3/uL (ref 0.1–1.0)
NEUTROS ABS: 4.7 10*3/uL (ref 1.7–7.7)
NEUTROS PCT: 61 %
Platelets: 176 10*3/uL (ref 150–400)
RBC: 4.95 MIL/uL (ref 3.87–5.11)
RDW: 14.4 % (ref 11.5–15.5)
WBC: 7.7 10*3/uL (ref 4.0–10.5)

## 2017-01-14 LAB — RAPID URINE DRUG SCREEN, HOSP PERFORMED
Amphetamines: NOT DETECTED
BARBITURATES: NOT DETECTED
Benzodiazepines: NOT DETECTED
COCAINE: NOT DETECTED
Opiates: NOT DETECTED
TETRAHYDROCANNABINOL: NOT DETECTED

## 2017-01-14 LAB — ETHANOL: Alcohol, Ethyl (B): <5 mg/dL (ref <5)

## 2017-01-14 LAB — COMPREHENSIVE METABOLIC PANEL
ALBUMIN: 4.9 g/dL (ref 3.5–5.0)
ALT: 32 U/L (ref 14–54)
ANION GAP: 11 (ref 5–15)
AST: 25 U/L (ref 15–41)
Alkaline Phosphatase: 51 U/L (ref 38–126)
BUN: 9 mg/dL (ref 6–20)
CHLORIDE: 108 mmol/L (ref 101–111)
CO2: 21 mmol/L — AB (ref 22–32)
Calcium: 9.6 mg/dL (ref 8.9–10.3)
Creatinine, Ser: 0.81 mg/dL (ref 0.44–1.00)
GFR calc Af Amer: >60 mL/min (ref >60)
GFR calc non Af Amer: >60 mL/min (ref >60)
GLUCOSE: 99 mg/dL (ref 65–99)
POTASSIUM: 3.6 mmol/L (ref 3.5–5.1)
SODIUM: 140 mmol/L (ref 135–145)
TOTAL PROTEIN: 7.7 g/dL (ref 6.5–8.1)
Total Bilirubin: 1 mg/dL (ref 0.3–1.2)

## 2017-01-14 NOTE — ED Notes (Signed)
Patients mother called.  Pt shook her head in the affirmative that she she could have information but the pt could not tell this Clinical research associate what her mothers name is.  No information released and mother is aware that we can not release information to her.

## 2017-01-14 NOTE — ED Notes (Signed)
Pt. Declines visit from Mother.

## 2017-01-14 NOTE — ED Notes (Signed)
Hourly rounding reveals patient in room. No complaints, stable, in no acute distress. Q15 minute rounds and monitoring via Security Cameras to continue. 

## 2017-01-14 NOTE — ED Notes (Addendum)
Sitting quietly in room eating, pt declined to converse or answer questions.  Whispering at times.

## 2017-01-14 NOTE — H&P (Signed)
Behavioral Health Medical Screening Exam  Tracy Gross is an 26 y.o. female.  Total Time spent with patient: 15 minutes  Psychiatric Specialty Exam: Physical Exam  Constitutional: She appears well-developed and well-nourished. No distress.  HENT:  Head: Normocephalic and atraumatic.  Right Ear: External ear normal.  Left Ear: External ear normal.  Eyes: Conjunctivae are normal. Pupils are equal, round, and reactive to light. Right eye exhibits no discharge. Left eye exhibits no discharge. No scleral icterus.  Neck: Normal range of motion.  Cardiovascular: Normal rate, regular rhythm and normal heart sounds.   Respiratory: Effort normal and breath sounds normal. No respiratory distress.  Musculoskeletal: Normal range of motion.  Neurological: She is alert. She is disoriented.  Patient disoriented to time   Skin: Skin is warm. She is not diaphoretic.  Psychiatric: Her speech is normal. Her mood appears anxious. Her affect is blunt. She is slowed and withdrawn. She is not actively hallucinating. Thought content is not paranoid and not delusional. She expresses impulsivity and inappropriate judgment. She exhibits a depressed mood. She expresses no homicidal and no suicidal ideation. She exhibits abnormal recent memory and abnormal remote memory. She is inattentive.    Review of Systems  Constitutional: Negative.   HENT: Negative.   Eyes: Negative.   Respiratory: Negative.   Cardiovascular: Negative.   Gastrointestinal: Negative.   Genitourinary: Negative.   Musculoskeletal: Negative.   Skin: Negative.   Neurological: Negative.   Endo/Heme/Allergies: Negative.   Psychiatric/Behavioral: Positive for depression. Negative for hallucinations, memory loss, substance abuse and suicidal ideas. The patient is nervous/anxious and has insomnia.     Blood pressure (!) 146/90, pulse (!) 114, temperature 99 F (37.2 C), resp. rate 16, SpO2 97 %.There is no height or weight on file to calculate  BMI.  General Appearance: Casual  Eye Contact:  Poor  Speech:  Normal Rate  Volume:  Normal  Mood:  Anxious, Depressed, Dysphoric, Hopeless, Irritable and Worthless  Affect:  Blunt  Thought Process:    Orientation:  Other:  Disoriented to time  Thought Content:  Patient provides brief one word answers, difficult to assess thought content  Suicidal Thoughts:  No  Homicidal Thoughts:  No  Memory:  Immediate;   Fair Recent;   Fair Remote;   Fair  Judgement:  Impaired  Insight:  Lacking  Psychomotor Activity:  Restlessness  Concentration: Concentration: Poor and Attention Span: Poor  Recall:  Poor  Fund of Knowledge:Poor  Language: Fair  Akathisia:  No  Handed:  Right  AIMS (if indicated):     Assets:  Desire for Improvement Financial Resources/Insurance Housing Leisure Time Physical Health  Sleep:       Musculoskeletal: Strength & Muscle Tone: within normal limits Gait & Station: normal   Blood pressure (!) 146/90, pulse (!) 114, temperature 99 F (37.2 C), resp. rate 16, SpO2 97 %.  Recommendations:  Based on my evaluation the patient does not appear to have an emergency medical condition. Recommend inpatient. Per AC, no appropriate beds at this time.  Jackelyn Poling, NP 01/14/2017, 1:15 AM

## 2017-01-14 NOTE — Progress Notes (Signed)
Per psychiatrist and NP request, CSW contacted patient's mother Alinah Sheard [phone]) to obtain collateral information. Patient's mother reported that patient has a mental health diagnosis of Bipolar I and that patient is currently prescribed Quetiapine /1 tablet at night and Hydroxyzine Hcl /1 every 6 hours as needed for anxiety. Patient's mother reported that she believes that patient stopped taking medication sometime this week and that patient disliked one of the medications due to weight gain. Patient's mother reported that patient was admitted to [facility] in January of 2017 for similar behaviors and followed up with a therapist. Patient's mother reported that patient was recently stressed and anxious about an interview for graduate school and that patient was put on a waitlist. She reports that patient works 2 jobs and is currently taking a graduate level class. Patient's mother reported that she picked patient up after being contacted by patient's roommate with concerns about patient. She reports that after picking patient up patient was confused and was having a hard time answering simple questions, taking Alka Falwell pauses and sometimes giving wrong answers. She reports that patient has been experiencing a lack of sleep and is not aware of any hx of self-harm. She reported that patient lost her great grandmother in August and they were somewhat close. CSW informed psychiatrist and NP of collateral information obtained.   Celso Sickle, LCSWA [facility]  Clinical Social Worker [phone]

## 2017-01-14 NOTE — ED Notes (Signed)
Hourly rounding reveals patient sleeping in room. No complaints, stable, in no acute distress. Q15 minute rounds and monitoring via Security Cameras to continue. 

## 2017-01-14 NOTE — ED Notes (Addendum)
Pt. Transferred to [facility] from ED to room after screening for contraband. Report to include Situation, Background, Assessment and Recommendations from [facility]. Pt. Oriented to unit including Q15 minute rounds as well as the security cameras for their protection. Patient is alert  warm and dry in no acute distress. Will not respond to questions about orientation. Patient denies SI, and AVH. Pt. States she wants to hurt people but not specific about who. Pt. Encouraged to let me know if needs arise.

## 2017-01-14 NOTE — ED Provider Notes (Signed)
[facility]-EMERGENCY DEPT Provider Note   CSN: [id number] Arrival date & time: 01/14/17  0116     History   Chief Complaint Chief Complaint  Patient presents with  . Medical Clearance    HPI Tracy Gross is a 26 y.o. female.  Patient presents emergency department accompanied by her parents with a chief complaint of "psychotic break."  Her parents took her to behavioral health this afternoon, and were rerouted to the emergency department because Behavioral Health does not have any inpatient beds. Behavioral Health recommended inpatient treatment. Patient does not contribute to her history.  History is provided by her parents, who state that she has bipolar 1 and has been severely depressed for the past several weeks.  She now doesn't communicate with people and seems "zoned out."  Level 5 caveat applies 2/2 psychiatric disorder.   The history is provided by the patient. No language interpreter was used.    No past medical history on file.  Patient Active Problem List   Diagnosis Date Noted  . Urinary tract infectious disease   . MDD (major depressive disorder), single episode, severe with psychotic features (HCC) 11/05/2015  . UTI (urinary tract infection) 11/01/2015    No past surgical history on file.  OB History    No data available       Home Medications    Prior to Admission medications   Medication Sig Start Date End Date Taking? Authorizing Provider  benztropine (COGENTIN) 0.5 MG tablet Take 1 tablet (0.5 mg total) by mouth 2 (two) times daily. 11/10/15   Beau Fanny, FNP  citalopram (CELEXA) 20 MG tablet Take 1 tablet (20 mg total) by mouth daily. 11/10/15   Beau Fanny, FNP  haloperidol (HALDOL) 1 MG tablet Take 3 tablets (3 mg total) by mouth 2 (two) times daily. 11/10/15   Beau Fanny, FNP  hydrOXYzine (ATARAX/VISTARIL) 25 MG tablet Take 1 tablet (25 mg total) by mouth every 6 (six) hours as needed for anxiety. 11/10/15   Beau Fanny, FNP  traZODone  (DESYREL) 100 MG tablet Take 1 tablet (100 mg total) by mouth at bedtime as needed for sleep. 11/10/15   Beau Fanny, FNP    Family History Family History  Problem Relation Age of Onset  . Mental illness Neg Hx     Social History Social History  Substance Use Topics  . Smoking status: Never Smoker  . Smokeless tobacco: Not on file  . Alcohol use Yes     Comment: occasional      Allergies   Nickel   Review of Systems Review of Systems  Unable to perform ROS: Psychiatric disorder     Physical Exam Updated Vital Signs BP (!) 150/92 (BP Location: Right Arm)   Pulse 70   Temp 98.4 F (36.9 C) (Oral)   Resp 20   SpO2 99%   Physical Exam  Constitutional: She appears well-developed and well-nourished.  HENT:  Head: Normocephalic and atraumatic.  Eyes: Conjunctivae and EOM are normal. Pupils are equal, round, and reactive to light.  Neck: Normal range of motion. Neck supple.  Cardiovascular: Normal rate and regular rhythm.  Exam reveals no gallop and no friction rub.   No murmur heard. Pulmonary/Chest: Effort normal and breath sounds normal. No respiratory distress. She has no wheezes. She has no rales. She exhibits no tenderness.  Abdominal: Soft. Bowel sounds are normal. She exhibits no distension and no mass. There is no tenderness. There is no rebound and no guarding.  Musculoskeletal: Normal range of motion. She exhibits no edema or tenderness.  Neurological: She is alert.  Alert Will not communicate verbally, but does nod yes and no to certain questions  Skin: Skin is warm and dry.  Psychiatric:  Withdrawn, flat affect  Nursing note and vitals reviewed.    ED Treatments / Results  Labs (all labs ordered are listed, but only abnormal results are displayed) Labs Reviewed  COMPREHENSIVE METABOLIC PANEL - Abnormal; Notable for the following:       Result Value   CO2 21 (*)    All other components within normal limits  ETHANOL  CBC WITH  DIFFERENTIAL/PLATELET  RAPID URINE DRUG SCREEN, HOSP PERFORMED    EKG  EKG Interpretation None       Radiology No results found.  Procedures Procedures (including critical care time)  Medications Ordered in ED Medications - No data to display   Initial Impression / Assessment and Plan / ED Course  I have reviewed the triage vital signs and the nursing notes.  Pertinent labs & imaging results that were available during my care of the patient were reviewed by me and considered in my medical decision making (see chart for details).     Patient with bipolar and MDD with psychotic features.    [facility] recommends inpatient treatment.  No beds available.  Psych hold.  Medically clear pending labs.  5:09 AM Medically clear.  Awaiting placement.  Final Clinical Impressions(s) / ED Diagnoses   Final diagnoses:  Medical clearance for psychiatric admission    New Prescriptions New Prescriptions   No medications on file     Roxy Horseman, PA-C 01/14/17 [id number]    Zadie Rhine, MD 01/15/17 [phone]

## 2017-01-14 NOTE — ED Notes (Signed)
Up walking in the hall 

## 2017-01-14 NOTE — ED Notes (Signed)
Up in hall pt appeared to be anxious, and hyperventilating.  Support given, pt was able to calm herself.  Magazines/paper/crayons given.  Pt non verbal during episode, declining to speak or answer questions.

## 2017-01-14 NOTE — BH Assessment (Addendum)
Tele Assessment Note   Tracy Gross is an 26 y.o. female who presents to [facility] as a walk-in voluntarily accompanied by her parents. Pt did not wish to allow her parents to be in the room during the assessment, therefore they waited in the lobby while the assessment was being completed. Pt presents disoriented and confused. Pt was asked to identify her DOB and she stated "I don't know." Pt was then asked if she could identify her birth month and she stated "September." Pt appeared to be responding to internal stimuli, looking around the room and up towards the ceiling. Pt was asked if she has experienced SI and she stated "like 20 or 30 years ago." Pt was also unable to identify her living arrangements. Pt stated "I'm in between my parents." Pt was slow to respond during the assessment, often pausing for up to a minute before speaking and sometimes the pt did not speak at all during the assessment, she starred blankly at the assessor. Pt was asked to describe her history with inpt hospitalizations and she stated "like 6 months ago." Pt was evaluated and admitted to [facility] in January 2017. Pt denies any current providers. Pt denies HI. Pt reports her "only stressor is being on the wait list for grad school." Pt denies any other current stressors.   Per Nira Conn, NP pt meets criteria for inpt treatment. TTS to seek placement. Pt sent to [facility] for medical clearance and to await bed placement. Charge nurse Victorino Dike, RN contacted and notified of the pt's arrival.  Diagnosis: Psychotic D/O   Past Medical History: No past medical history on file.  No past surgical history on file.  Family History:  Family History  Problem Relation Age of Onset   Mental illness Neg Hx     Social History:  reports that she has never smoked. She does not have any smokeless tobacco history on file. She reports that she drinks alcohol. She reports that she does not use drugs.  Additional Social History:  Alcohol / Drug  Use Pain Medications: See PTA meds Prescriptions: See PTA meds Over the Counter: See PTA meds  CIWA: CIWA-Ar BP: (!) 146/90 Pulse Rate: (!) 114 COWS:    PATIENT STRENGTHS: (choose at least two) Financial means Supportive family/friends  Allergies:  Allergies  Allergen Reactions   Nickel Rash    Home Medications:  (Not in a hospital admission)  OB/GYN Status:  No LMP recorded.  General Assessment Data Location of Assessment: [facility] Assessment Services TTS Assessment: In system Is this a Tele or Face-to-Face Assessment?: Face-to-Face Is this an Initial Assessment or a Re-assessment for this encounter?: Initial Assessment Marital status: Single Is patient pregnant?: No Pregnancy Status: No Living Arrangements: Parent Can pt return to current living arrangement?: Yes Admission Status: Voluntary Is patient capable of signing voluntary admission?: Yes Referral Source: Self/Family/Friend Insurance type: Designer, industrial/product Exam [facility] Walk-in ONLY) Medical Exam completed: Yes  Crisis Care Plan Living Arrangements: Parent Name of Psychiatrist: none Name of Therapist: none  Education Status Is patient currently in school?: No Highest grade of school patient has completed: Bachelor's Degree  Risk to self with the past 6 months Suicidal Ideation: No Has patient been a risk to self within the past 6 months prior to admission? : No Suicidal Intent: No Has patient had any suicidal intent within the past 6 months prior to admission? : No Is patient at risk for suicide?: No Suicidal Plan?: No Has patient had any suicidal plan  within the past 6 months prior to admission? : No Access to Means: No What has been your use of drugs/alcohol within the last 12 months?: denies regular use  Previous Attempts/Gestures: Yes How many times?: 1 Triggers for Past Attempts: Unpredictable Intentional Self Injurious Behavior: None Family Suicide History: No Recent stressful life  event(s): Other (Comment) (pt reports she is on the wait list for grad school ) Persecutory voices/beliefs?: No Depression: Yes Depression Symptoms: Insomnia, Fatigue, Isolating Substance abuse history and/or treatment for substance abuse?: No Suicide prevention information given to non-admitted patients: Not applicable  Risk to Others within the past 6 months Homicidal Ideation: No Does patient have any lifetime risk of violence toward others beyond the six months prior to admission? : No Thoughts of Harm to Others: No Current Homicidal Intent: No Current Homicidal Plan: No Access to Homicidal Means: No History of harm to others?: No Assessment of Violence: None Noted Does patient have access to weapons?: No Criminal Charges Pending?: No Does patient have a court date: No Is patient on probation?: No  Psychosis Hallucinations:  (pt denies, appears to be hallucinating during assessment ) Delusions: Unspecified  Mental Status Report Appearance/Hygiene: Disheveled, Bizarre Eye Contact: Poor Motor Activity: Unsteady Speech: Slow, Soft, Incoherent Level of Consciousness: Alert Mood: Anxious, Preoccupied Affect: Inconsistent with thought content, Anxious Anxiety Level: Moderate Thought Processes: Irrelevant, Thought Blocking Judgement: Impaired Orientation: Not oriented Obsessive Compulsive Thoughts/Behaviors: None  Cognitive Functioning Concentration: Poor Memory: Remote Impaired, Recent Impaired IQ: Average Insight: Poor Impulse Control: Fair Appetite: Good Sleep: Decreased Total Hours of Sleep:  (pt reports she has not slept in 3 days) Vegetative Symptoms: None  ADLScreening [facility] Assessment Services) Patient's cognitive ability adequate to safely complete daily activities?: Yes Patient able to express need for assistance with ADLs?: Yes Independently performs ADLs?: Yes (appropriate for developmental age)  Prior Inpatient Therapy Prior Inpatient Therapy:  Yes Prior Therapy Dates: 2017 Prior Therapy Facilty/Provider(s): [facility] Reason for Treatment: MDD  Prior Outpatient Therapy Prior Outpatient Therapy: Yes Prior Therapy Dates: pt unknown Prior Therapy Facilty/Provider(s): unknown Reason for Treatment: MDD Does patient have an ACCT team?: No Does patient have Intensive In-House Services?  : No Does patient have [facility] services? : No Does patient have P4CC services?: No  ADL Screening (condition at time of admission) Patient's cognitive ability adequate to safely complete daily activities?: Yes Is the patient deaf or have difficulty hearing?: No Does the patient have difficulty seeing, even when wearing glasses/contacts?: No Does the patient have difficulty concentrating, remembering, or making decisions?: Yes Patient able to express need for assistance with ADLs?: Yes Does the patient have difficulty dressing or bathing?: No Independently performs ADLs?: Yes (appropriate for developmental age) Does the patient have difficulty walking or climbing stairs?: No Weakness of Legs: None Weakness of Arms/Hands: None  Home Assistive Devices/Equipment Home Assistive Devices/Equipment: Eyeglasses    Abuse/Neglect Assessment (Assessment to be complete while patient is alone) Physical Abuse: Denies Verbal Abuse: Denies Sexual Abuse: Denies Exploitation of patient/patient's resources: Denies Self-Neglect: Denies     Merchant navy officer (For Healthcare) Does Patient Have a Medical Advance Directive?: No Would patient like information on creating a medical advance directive?: No - Patient declined    Additional Information 1:1 In Past 12 Months?: No CIRT Risk: No Elopement Risk: No Does patient have medical clearance?:  (pending)     Disposition:  Disposition Initial Assessment Completed for this Encounter: Yes Disposition of Patient: Inpatient treatment program Type of inpatient treatment program: Adult (per Nira Conn,  NP  )  Karolee Ohs 01/14/2017 1:29 AM

## 2017-01-14 NOTE — ED Notes (Signed)
Pt. Brought in with family, mother stated that she has been confused and not behaving according to baseline. This has happened in the past and seems to her to be happening again.

## 2017-01-14 NOTE — ED Notes (Signed)
Up in hall smiling

## 2017-01-14 NOTE — ED Notes (Signed)
Pt. Still declining visitors.

## 2017-01-14 NOTE — ED Notes (Signed)
Report to include Situation, Background, Assessment, and Recommendations received from Janie RN. Patient alert and oriented, warm and dry, in no acute distress. Patient denies SI, HI, AVH and pain. Patient made aware of Q15 minute rounds and security cameras for their safety. Patient instructed to come to me with needs or concerns.  

## 2017-01-15 DIAGNOSIS — F323 Major depressive disorder, single episode, severe with psychotic features: Secondary | ICD-10-CM

## 2017-01-15 DIAGNOSIS — Z79899 Other long term (current) drug therapy: Secondary | ICD-10-CM

## 2017-01-15 MED ORDER — QUETIAPINE FUMARATE ER 50 MG PO TB24
100.0000 mg | ORAL_TABLET | Freq: Every day | ORAL | Status: DC
  Administered 2017-01-15: 100 mg via ORAL
  Filled 2017-01-15 (×2): qty 2

## 2017-01-15 NOTE — ED Notes (Signed)
Pt. escorted out of shower with sheet wrapped around her to her room and instructed to dress.

## 2017-01-15 NOTE — ED Notes (Signed)
Pt. Has been in shower since before shift change. Redirection/request to leave shower room ineffective. Darel Hong RN and Katrina MHT were able to wrap patient in sheet and escort her to her room.

## 2017-01-15 NOTE — ED Notes (Signed)
Hourly rounding reveals patient in room. No complaints, stable, in no acute distress. Q15 minute rounds and monitoring via Security Cameras to continue. 

## 2017-01-15 NOTE — ED Notes (Signed)
Patient is currently lying on the floor and refuses to get up.  Patient refused blood draw for hCG.  Patient also refused in and out cath.  She is uncooperative with staff.

## 2017-01-15 NOTE — Progress Notes (Signed)
CSW faxed patient's referral to: [place], [place], [place], [place], [place], [address]st [address], [address] and [place].  Celso Sickle, LCSWA [facility]  Clinical Social Worker [phone]

## 2017-01-15 NOTE — ED Notes (Signed)
Patient is currently in the shower without her clothes on.  Have attempted to coax patient out several times and she refuses.  When staff asks patient to put her clothes back on, she just sighs.  Patient is being uncooperative and will not speak.

## 2017-01-15 NOTE — ED Notes (Signed)
Pt. Refusing to to get out of shower. Redirection ineffective. Pt. draped in bed sheet and escorted to her room with assistance of Katrina MHT. Pt. Encouraged to dry off and get dressed.

## 2017-01-15 NOTE — ED Notes (Signed)
Hourly rounding reveals patient sleeping in room. No complaints, stable, in no acute distress. Q15 minute rounds and monitoring via Security Cameras to continue. 

## 2017-01-15 NOTE — ED Notes (Signed)
Patient is in the hallway.  She is needing more direction this afternoon.  Patient is keeping her eyes closed while she is ambulating.

## 2017-01-15 NOTE — ED Notes (Signed)
Pt. Walking in hall to other side of unit. Redirected to her side and then again walked to other side of unit. Pt. Finally redirected to her room.

## 2017-01-15 NOTE — ED Notes (Signed)
Report to include situation, background, assessment and recommendations from [facility]. Patient in shower.

## 2017-01-15 NOTE — ED Notes (Signed)
Patient alert, warm and dry, in no acute distress. Somewhat uncooperative. Patient denies SI, HI, AVH and pain. Patient made aware of Q15 minute rounds and security cameras for their safety. Patient instructed to come to me with needs or concerns.

## 2017-01-15 NOTE — ED Notes (Signed)
Pt. Still standing in her room wrapped in a sheet responding to internal stimuli.

## 2017-01-15 NOTE — ED Notes (Signed)
Requested urine from patient multiple times today.  She has refused to give a sample.  Need pregnancy result before medication administration.  Patient refused in and out cath.  She is voluntary and stated, "no."  She has since gone to the bathroom and is currently standing in the shower with her bra and panties on.  Gave patient some towels and told her to turn on the water if she wanted to take a shower.  NP and this nurse had spoke with patient earlier.  Patient kept her eyes shut and would not answer questions.  Patient at one point opened one eye and smiled.

## 2017-01-15 NOTE — ED Notes (Signed)
Patient walking in hall requiring redirection from other side of unit.

## 2017-01-15 NOTE — ED Notes (Signed)
Hourly rounding reveals patient sitting in room. No complaints, stable, in no acute distress. Q15 minute rounds and monitoring via Security Cameras to continue. 

## 2017-01-15 NOTE — ED Notes (Signed)
Pt. Finally redressed herself in scrubs.

## 2017-01-15 NOTE — Consult Note (Signed)
[facility] Face-to-Face Psychiatry Consult   Reason for Consult:  Psychosis depression Referring Physician:  EDP Patient Identification: Tracy Gross MRN:  [id number] Principal Diagnosis: MDD (major depressive disorder), single episode, severe with psychotic features [facility]) Diagnosis:   Patient Active Problem List   Diagnosis Date Noted  . Urinary tract infectious disease [N39.0]   . MDD (major depressive disorder), single episode, severe with psychotic features ([place]) [F32.3] 11/05/2015  . UTI (urinary tract infection) [N39.0] 11/01/2015    Total Time spent with patient: 30 minutes  Subjective:   Tracy Gross is a 26 y.o. female patient admitted with depression and psychotic behavior.Marland Kitchen  HPI:  Patient is 26 year old Caucasian female who presented to [place] as a walk-in voluntary accompanied by her parents.  Initially she did not allow her parents to be seen in the assessment.  Patient appears confused, disoriented and poor historian.  She admitted that she is feeling bad about herself.  She was noticed talking to herself as responding to internal stimuli.  She was preoccupied to self.  She was unable to provide much information and most information was obtained through electronic medical record and her family member.  Social worker called family member and apparently patient was diagnosed with bipolar disorder and she was prescribed Seroquel however she's been noncompliant with medication for some time.  She was hospitalized in 2017 and behavioral [place] for similar behavior.  As per mother patient recently stressed about a job interview and she was placed on wait lists.  She works 2 jobs and currently taking up graduate level class.  In the emergency room patient remains very psychotic and looking around at ceiling and walls.  Her UDS is negative.  Her blood alcohol less than 5.  Her comprehensive metabolic panel shows normal.  The patient denies any suicidal thoughts or  homicidal thought but remains very guarded, paranoid, thought blocking and uncooperative.  Past Psychiatric History: Reviewed.  Patient has history of psychiatric hospitalization in 2017 for similar behavior.  Risk to Self: Is patient at risk for suicide?: No, but patient needs Medical Clearance Risk to Others:   Prior Inpatient Therapy:   Prior Outpatient Therapy:    Past Medical History: No past medical history on file. No past surgical history on file. Family History:  Family History  Problem Relation Age of Onset  . Mental illness Neg Hx    Family Psychiatric  History: Reviewed. Social History:  History  Alcohol Use  . Yes    Comment: occasional      History  Drug Use No    Social History   Social History  . Marital status: Single    Spouse name: N/A  . Number of children: N/A  . Years of education: N/A   Social History Main Topics  . Smoking status: Never Smoker  . Smokeless tobacco: Not on file  . Alcohol use Yes     Comment: occasional   . Drug use: No  . Sexual activity: Not on file   Other Topics Concern  . Not on file   Social History Narrative  . No narrative on file   Additional Social History:    Allergies:   Allergies  Allergen Reactions  . Nickel Rash    Labs:  Results for orders placed or performed during the hospital encounter of 01/14/17 (from the past 48 hour(s))  Comprehensive metabolic panel     Status: Abnormal   Collection Time: 01/14/17  3:38 AM  Result Value Ref Range  Sodium 140 135 - 145 mmol/L   Potassium 3.6 3.5 - 5.1 mmol/L   Chloride 108 101 - 111 mmol/L   CO2 21 (L) 22 - 32 mmol/L   Glucose, Bld 99 65 - 99 mg/dL   BUN 9 6 - 20 mg/dL   Creatinine, Ser 0.81 0.44 - 1.00 mg/dL   Calcium 9.6 8.9 - 10.3 mg/dL   Total Protein 7.7 6.5 - 8.1 g/dL   Albumin 4.9 3.5 - 5.0 g/dL   AST 25 15 - 41 U/L   ALT 32 14 - 54 U/L   Alkaline Phosphatase 51 38 - 126 U/L   Total Bilirubin 1.0 0.3 - 1.2 mg/dL   GFR calc non Af Amer >60  >60 mL/min   GFR calc Af Amer >60 >60 mL/min    Comment: (NOTE) The eGFR has been calculated using the CKD EPI equation. This calculation has not been validated in all clinical situations. eGFR's persistently <60 mL/min signify possible Chronic Kidney Disease.    Anion gap 11 5 - 15  Ethanol     Status: None   Collection Time: 01/14/17  3:38 AM  Result Value Ref Range   Alcohol, Ethyl (B) <5 <5 mg/dL    Comment:        LOWEST DETECTABLE LIMIT FOR SERUM ALCOHOL IS 5 mg/dL FOR MEDICAL PURPOSES ONLY   CBC with Diff     Status: None   Collection Time: 01/14/17  3:38 AM  Result Value Ref Range   WBC 7.7 4.0 - 10.5 K/uL   RBC 4.95 3.87 - 5.11 MIL/uL   Hemoglobin 13.4 12.0 - 15.0 g/dL   HCT 40.2 36.0 - 46.0 %   MCV 81.2 78.0 - 100.0 fL   MCH 27.1 26.0 - 34.0 pg   MCHC 33.3 30.0 - 36.0 g/dL   RDW 14.4 11.5 - 15.5 %   Platelets 176 150 - 400 K/uL   Neutrophils Relative % 61 %   Neutro Abs 4.7 1.7 - 7.7 K/uL   Lymphocytes Relative 29 %   Lymphs Abs 2.2 0.7 - 4.0 K/uL   Monocytes Relative 9 %   Monocytes Absolute 0.7 0.1 - 1.0 K/uL   Eosinophils Relative 1 %   Eosinophils Absolute 0.1 0.0 - 0.7 K/uL   Basophils Relative 0 %   Basophils Absolute 0.0 0.0 - 0.1 K/uL  Urine rapid drug screen (hosp performed)not at [facility]     Status: None   Collection Time: 01/14/17  3:51 AM  Result Value Ref Range   Opiates NONE DETECTED NONE DETECTED   Cocaine NONE DETECTED NONE DETECTED   Benzodiazepines NONE DETECTED NONE DETECTED   Amphetamines NONE DETECTED NONE DETECTED   Tetrahydrocannabinol NONE DETECTED NONE DETECTED   Barbiturates NONE DETECTED NONE DETECTED    Comment:        DRUG SCREEN FOR MEDICAL PURPOSES ONLY.  IF CONFIRMATION IS NEEDED FOR ANY PURPOSE, NOTIFY LAB WITHIN 5 DAYS.        LOWEST DETECTABLE LIMITS FOR URINE DRUG SCREEN Drug Class       Cutoff (ng/mL) Amphetamine      1000 Barbiturate      200 Benzodiazepine   263 Tricyclics       785 Opiates           300 Cocaine          300 THC              50     Current Facility-Administered Medications  Medication Dose Route Frequency Provider Last Rate Last Dose  . QUEtiapine (SEROQUEL XR) 24 hr tablet 100 mg  100 mg Oral Daily Kathlee Nations, MD       Current Outpatient Prescriptions  Medication Sig Dispense Refill  . AZO-CRANBERRY PO Take 1 tablet by mouth once.    . hydrOXYzine (ATARAX/VISTARIL) 25 MG tablet Take 1 tablet (25 mg total) by mouth every 6 (six) hours as needed for anxiety. 30 tablet 0  . QUEtiapine (SEROQUEL) 200 MG tablet Take 200 mg by mouth at bedtime.    . benztropine (COGENTIN) 0.5 MG tablet Take 1 tablet (0.5 mg total) by mouth 2 (two) times daily. (Patient not taking: Reported on 01/14/2017) 60 tablet 0  . citalopram (CELEXA) 20 MG tablet Take 1 tablet (20 mg total) by mouth daily. (Patient not taking: Reported on 01/14/2017) 30 tablet 0  . haloperidol (HALDOL) 1 MG tablet Take 3 tablets (3 mg total) by mouth 2 (two) times daily. (Patient not taking: Reported on 01/14/2017) 180 tablet 0  . traZODone (DESYREL) 100 MG tablet Take 1 tablet (100 mg total) by mouth at bedtime as needed for sleep. (Patient not taking: Reported on 01/14/2017) 30 tablet 0    Musculoskeletal: Strength & Muscle Tone: within normal limits Gait & Station: Lying on the bed Patient leans: N/A  Psychiatric Specialty Exam: Physical Exam  ROS  Blood pressure 130/78, pulse (!) 104, temperature 98.1 F (36.7 C), temperature source Oral, resp. rate 18, SpO2 97 %.There is no height or weight on file to calculate BMI.  General Appearance: Bizarre, Disheveled and Guarded  Eye Contact:  Poor  Speech:  Garbled and Slurred  Volume:  Decreased  Mood:  Depressed, Hopeless and Irritable  Affect:  Labile and Restricted  Thought Process:  Disorganized  Orientation:  NA  Thought Content:  Illogical and Paranoid Ideation  Suicidal Thoughts:  No  Homicidal Thoughts:  No  Memory:  Immediate;   Poor Recent;    Poor Remote;   Poor  Judgement:  Impaired  Insight:  Lacking  Psychomotor Activity:  Psychomotor Retardation and Restlessness  Concentration:  Concentration: Poor and Attention Span: Poor  Recall:  Poor  Fund of Knowledge:  Poor  Language:  Poor  Akathisia:  No  Handed:  Right  AIMS (if indicated):     Assets:  Housing  ADL's:  Impaired  Cognition:  Impaired,  Moderate  Sleep:        Treatment Plan Summary: Daily contact with patient to assess and evaluate symptoms and progress in treatment and Plan Start Seroquel to help her psychosis. patient needs inpatient treatment for stabilization.  Disposition: Recommend psychiatric Inpatient admission when medically cleared.  Reesa Gotschall T., MD 01/15/2017 12:11 PM

## 2017-01-15 NOTE — ED Notes (Signed)
Patient got up off floor and started pacing the unit.  Patient had to be redirected a couple of times.  Patient started going down the other hallway.  She also went into shower and stood.  She was redirected back to her room.

## 2017-01-15 NOTE — ED Notes (Addendum)
Hourly rounding reveals patient in room. No complaints, stable, in no acute distress. Q15 minute rounds and monitoring via Security Cameras to continue. 

## 2017-01-15 NOTE — ED Notes (Signed)
Awaiting urine sample before administering seroquel.  Patient did not have pregnancy test at admission.

## 2017-01-16 NOTE — ED Notes (Signed)
Hourly rounding reveals patient sleeping in room. No complaints, stable, in no acute distress. Q15 minute rounds and monitoring via Security Cameras to continue. 

## 2017-01-16 NOTE — BH Assessment (Addendum)
[facility] Assessment Progress Note This writer attempted to assess patient's current mental state earlier this date although patient refused to interact with this Clinical research associate. Per Jannifer Franklin MD patient continues to meet inpatient criteria.

## 2017-01-16 NOTE — ED Notes (Signed)
Pt. Assisted to bed and tucked in by myself and two MHTs.

## 2017-01-16 NOTE — ED Notes (Signed)
Redirection to don scrubs and lie down attempted without success. Pt sitting in floor next to door wrapped in sheet.

## 2017-01-16 NOTE — Progress Notes (Signed)
01/16/17 1344:  LRT introduced self to pt and offered activities, pt refused.  Caroll Rancher, LRT/CTRS

## 2017-01-16 NOTE — BH Assessment (Signed)
[facility] Assessment Progress Note  Per Thedore Mins, MD, this pt requires psychiatric hospitalization at this time.  She also meets criteria for IVC, which he has initiated.  IVC documents have been faxed to [facility], and at [employer] confirms receipt.  As of this writing, service of Findings and Custody Order is pending.  Britta Mccreedy calls from [facility] to report that pt has been accepted to their facility by by Dr Loann Quill to [employer].  Nanine Means, DNP, concurs with this decision.  Pt's nurse, Rudean Hitt, has been notified, and agrees to call report to [phone].  Pt is to be transported via [facility].  She must arrive either before 23:00 tonight or after 06:00 tomorrow.  Doylene Canning, MA Triage Specialist [phone]

## 2017-01-16 NOTE — ED Notes (Signed)
Hourly rounding reveals patient standing in her room wrapped in a sheet swaying side to side. Stable, in no acute distress. Q15 minute rounds and monitoring via [employer] to continue.

## 2017-01-16 NOTE — ED Notes (Signed)
Pt. Up to rest/shower room wrapped in a sheet. Pt. Seen playing with shower controls. Pt. Redirected/escorted back to her room and tucked into bed with instructions to not come out of her room without scrubs on.

## 2017-07-13 IMAGING — CT CT HEAD W/O CM
1 series · 16 of 30 positions shown, 20 images · non-contrast
Comparison: None.

CLINICAL DATA: Intermittent confusion for 10 days, tremor and
blurry vision. History of anxiety.

EXAM:
CT HEAD WITHOUT CONTRAST
TECHNIQUE: Contiguous axial images were obtained from the base of the skull
through the vertex without intravenous contrast.

[Series 2: head wo · axial · 0.45mm/px · z∈[+213,+353]mm · 16 of 33 slices shown, 20 images]
[im 2/33  brain]
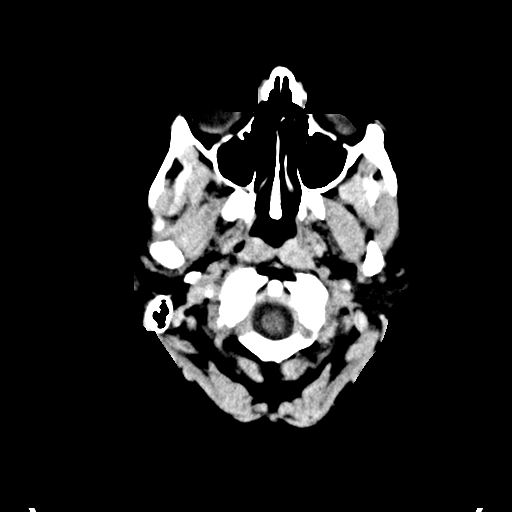
[im 2/33  bone]
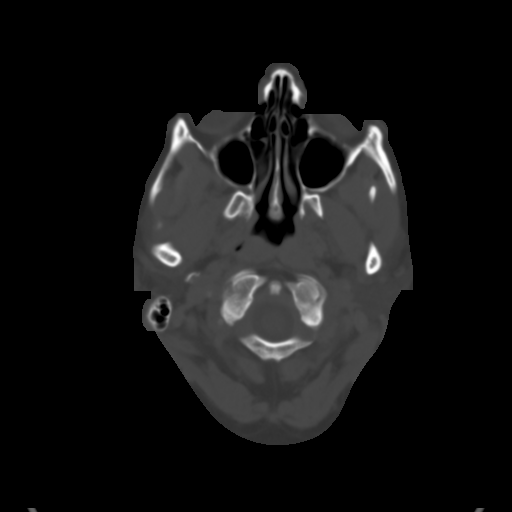
[im 4/33  brain]
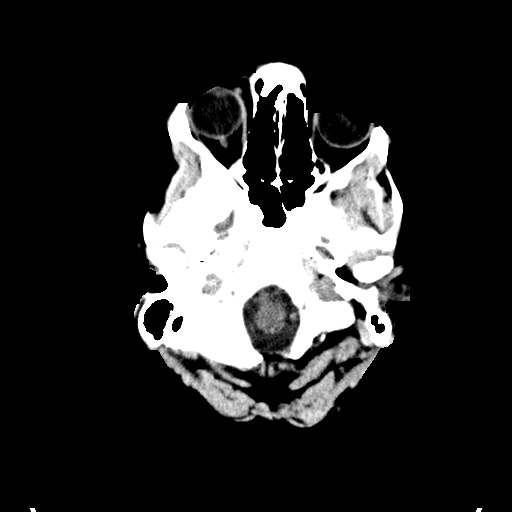
[im 6/33  brain]
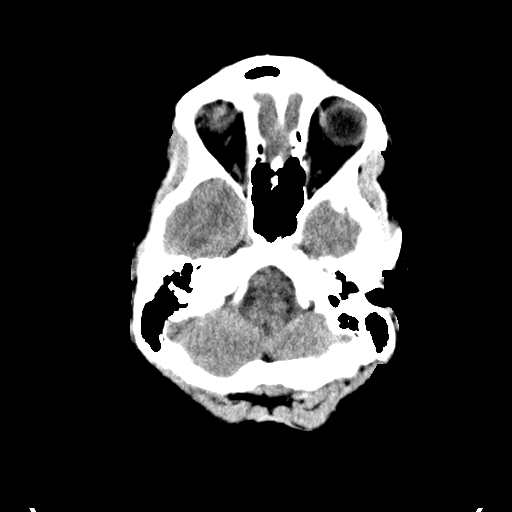
[im 8/33  brain]
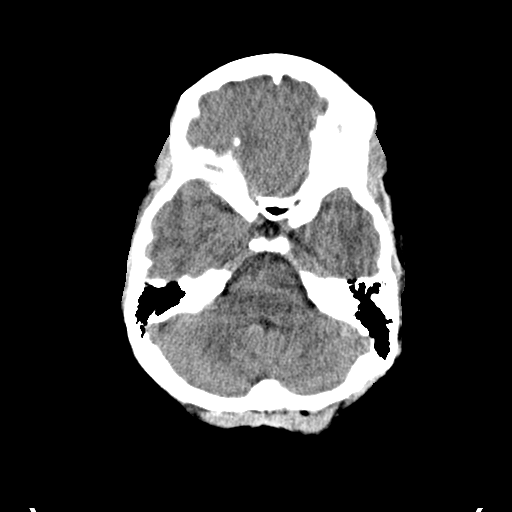
[im 9/33  brain]
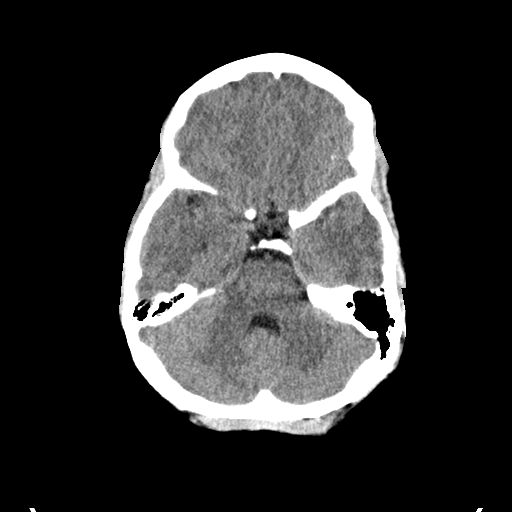
[im 9/33  bone]
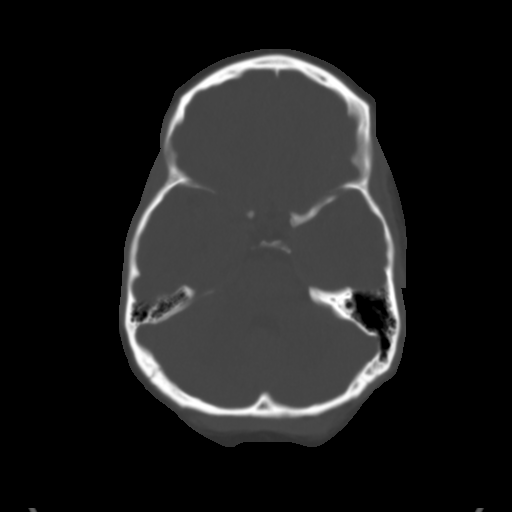
[im 12/33  brain]
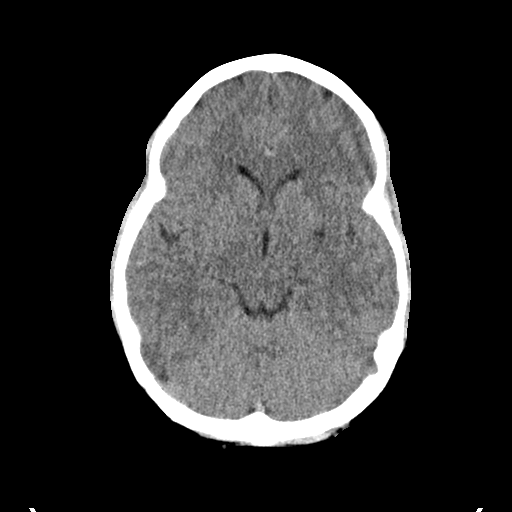
[im 14/33  brain]
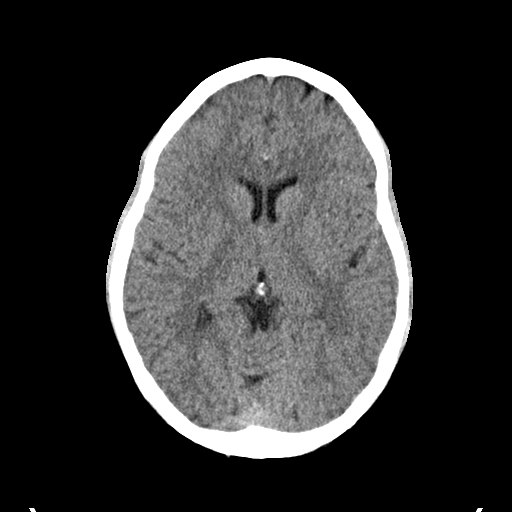
[im 16/33  brain]
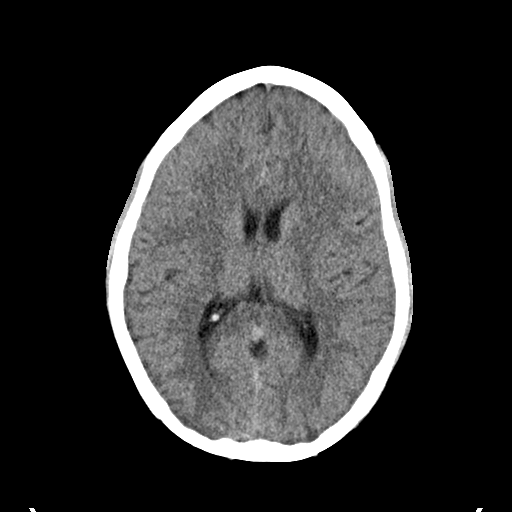
[im 17/33  brain]
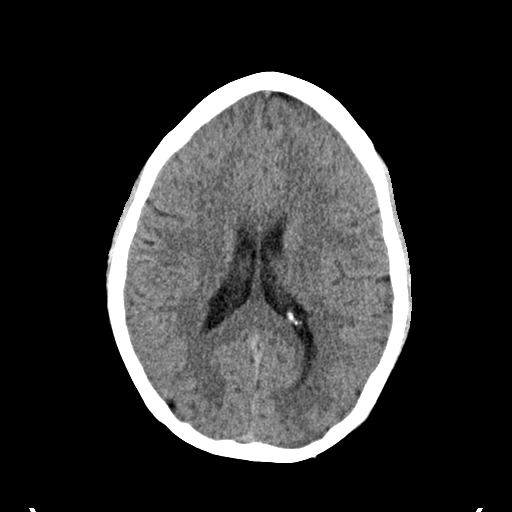
[im 17/33  bone]
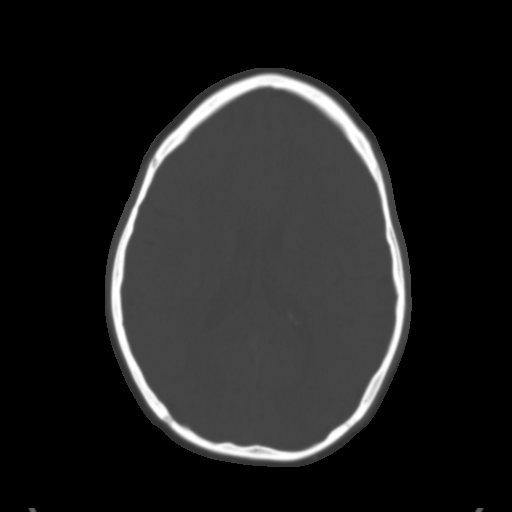
[im 19/33  brain]
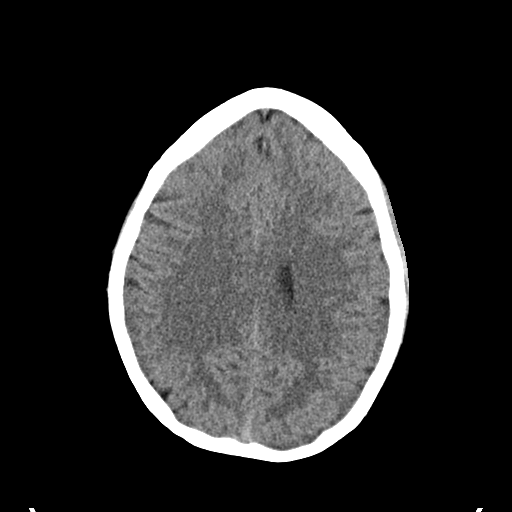
[im 21/33  brain]
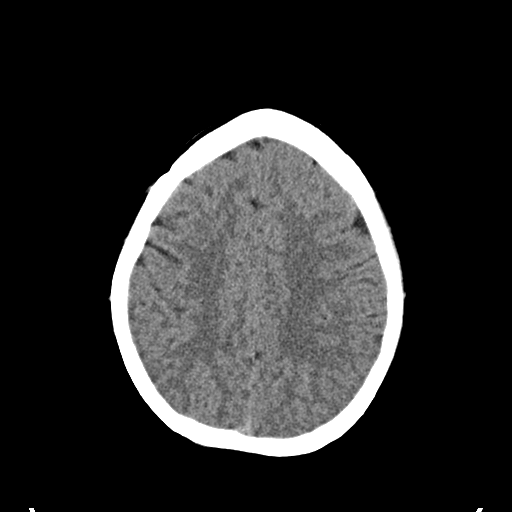
[im 24/33  brain]
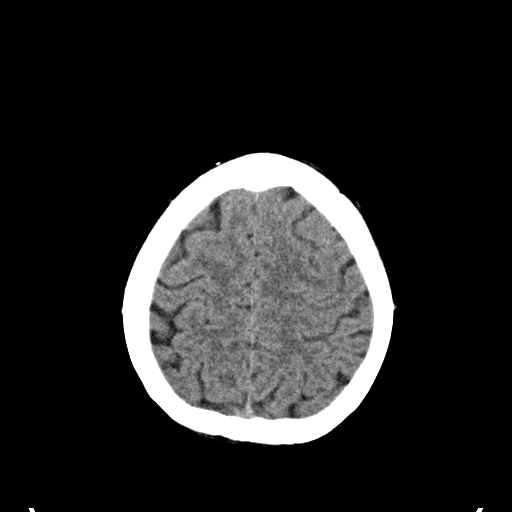
[im 25/33  brain]
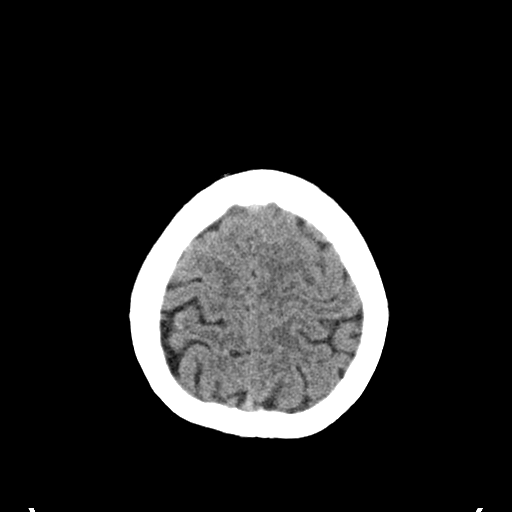
[im 25/33  bone]
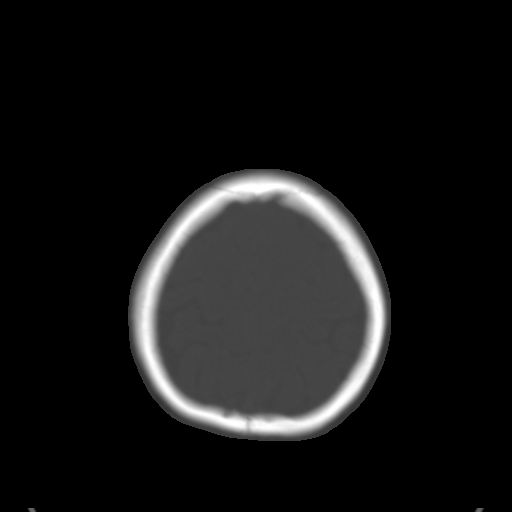
[im 27/33  brain]
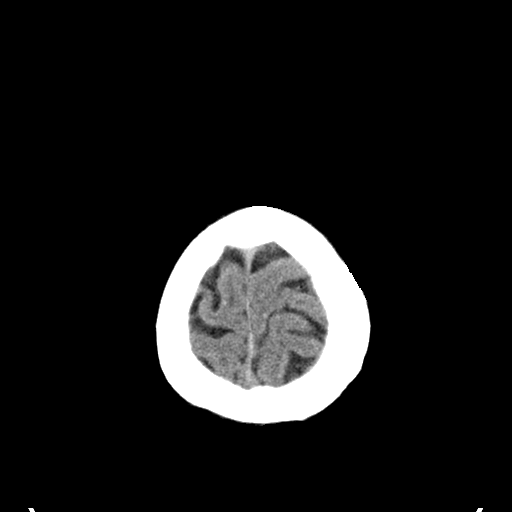
[im 29/33  brain]
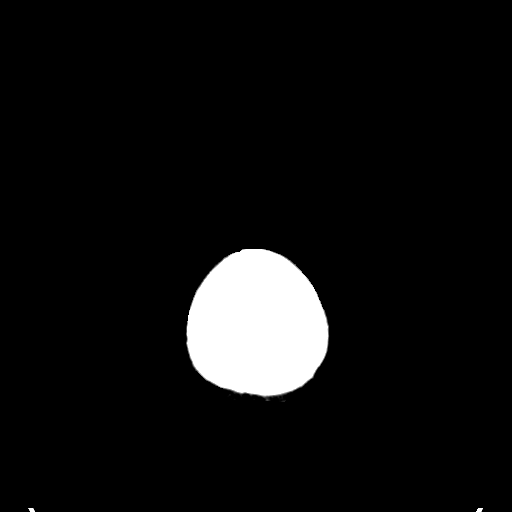
[im 31/33  brain]
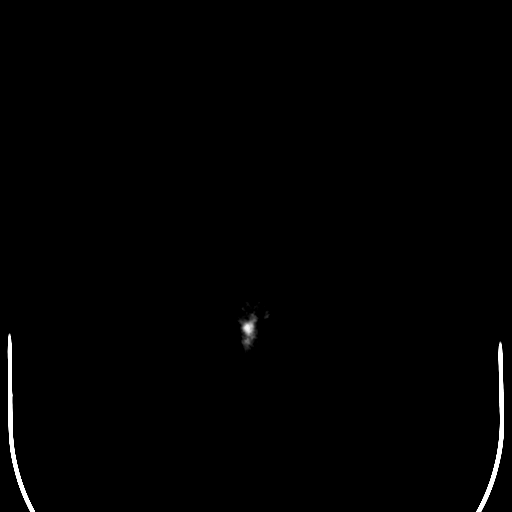

[16 of 30 positions shown; findings below may reference images not displayed]

FINDINGS: The ventricles and sulci are normal. No intraparenchymal hemorrhage,
mass effect nor midline shift. No acute large vascular territory
infarcts.

No abnormal extra-axial fluid collections. Basal cisterns are
patent.

No skull fracture. The included ocular globes and orbital contents
are non-suspicious. The mastoid aircells and included paranasal
sinuses are well-aerated.
IMPRESSION: Normal noncontrast CT head.
